# Patient Record
Sex: Male | Born: 1935 | Race: White | Hispanic: No | Marital: Single | Smoking: Former smoker
Health system: Southern US, Community
[De-identification: ages and names within clinical notes are randomized; demographics above are authoritative.]

## PROBLEM LIST (undated history)

## (undated) DIAGNOSIS — I10 Essential (primary) hypertension: Secondary | ICD-10-CM

## (undated) DIAGNOSIS — R001 Bradycardia, unspecified: Secondary | ICD-10-CM

## (undated) DIAGNOSIS — I255 Ischemic cardiomyopathy: Secondary | ICD-10-CM

## (undated) DIAGNOSIS — I441 Atrioventricular block, second degree: Secondary | ICD-10-CM

## (undated) DIAGNOSIS — R9430 Abnormal result of cardiovascular function study, unspecified: Secondary | ICD-10-CM

## (undated) DIAGNOSIS — E119 Type 2 diabetes mellitus without complications: Secondary | ICD-10-CM

## (undated) DIAGNOSIS — N433 Hydrocele, unspecified: Secondary | ICD-10-CM

## (undated) DIAGNOSIS — I340 Nonrheumatic mitral (valve) insufficiency: Secondary | ICD-10-CM

## (undated) DIAGNOSIS — IMO0002 Reserved for concepts with insufficient information to code with codable children: Secondary | ICD-10-CM

## (undated) DIAGNOSIS — I2510 Atherosclerotic heart disease of native coronary artery without angina pectoris: Secondary | ICD-10-CM

## (undated) DIAGNOSIS — I5022 Chronic systolic (congestive) heart failure: Secondary | ICD-10-CM

## (undated) HISTORY — DX: Abnormal result of cardiovascular function study, unspecified: R94.30

## (undated) HISTORY — DX: Reserved for concepts with insufficient information to code with codable children: IMO0002

## (undated) HISTORY — PX: CARDIAC SURGERY: SHX584

---

## 1997-07-14 HISTORY — PX: CORONARY STENT PLACEMENT: SHX1402

## 2001-03-08 ENCOUNTER — Emergency Department (HOSPITAL_COMMUNITY): Admission: EM | Admit: 2001-03-08 | Discharge: 2001-03-08 | Payer: Self-pay | Admitting: Emergency Medicine

## 2001-10-08 ENCOUNTER — Inpatient Hospital Stay (HOSPITAL_COMMUNITY): Admission: EM | Admit: 2001-10-08 | Discharge: 2001-10-21 | Payer: Self-pay | Admitting: Emergency Medicine

## 2001-10-08 ENCOUNTER — Encounter: Payer: Self-pay | Admitting: Emergency Medicine

## 2001-10-12 ENCOUNTER — Encounter: Payer: Self-pay | Admitting: Cardiothoracic Surgery

## 2001-10-12 DIAGNOSIS — I2510 Atherosclerotic heart disease of native coronary artery without angina pectoris: Secondary | ICD-10-CM

## 2001-10-12 HISTORY — PX: CORONARY ARTERY BYPASS GRAFT: SHX141

## 2001-10-12 HISTORY — DX: Atherosclerotic heart disease of native coronary artery without angina pectoris: I25.10

## 2001-10-13 ENCOUNTER — Encounter: Payer: Self-pay | Admitting: Cardiothoracic Surgery

## 2001-10-14 ENCOUNTER — Encounter: Payer: Self-pay | Admitting: Cardiothoracic Surgery

## 2001-10-15 ENCOUNTER — Encounter: Payer: Self-pay | Admitting: Cardiothoracic Surgery

## 2001-10-16 ENCOUNTER — Encounter: Payer: Self-pay | Admitting: Cardiothoracic Surgery

## 2001-10-19 ENCOUNTER — Encounter: Payer: Self-pay | Admitting: Cardiothoracic Surgery

## 2001-10-31 ENCOUNTER — Emergency Department (HOSPITAL_COMMUNITY): Admission: EM | Admit: 2001-10-31 | Discharge: 2001-10-31 | Payer: Self-pay | Admitting: Emergency Medicine

## 2001-12-29 ENCOUNTER — Encounter: Payer: Self-pay | Admitting: General Surgery

## 2001-12-30 ENCOUNTER — Observation Stay (HOSPITAL_COMMUNITY): Admission: RE | Admit: 2001-12-30 | Discharge: 2001-12-31 | Payer: Self-pay | Admitting: General Surgery

## 2013-04-02 ENCOUNTER — Emergency Department (INDEPENDENT_AMBULATORY_CARE_PROVIDER_SITE_OTHER)
Admission: EM | Admit: 2013-04-02 | Discharge: 2013-04-02 | Disposition: A | Discharge status: Home / Self Care | Payer: Medicare Other | Source: Home / Self Care | Attending: Family Medicine | Admitting: Family Medicine

## 2013-04-02 ENCOUNTER — Encounter (HOSPITAL_COMMUNITY): Payer: Self-pay | Admitting: *Deleted

## 2013-04-02 DIAGNOSIS — L0231 Cutaneous abscess of buttock: Secondary | ICD-10-CM

## 2013-04-02 HISTORY — DX: Atherosclerotic heart disease of native coronary artery without angina pectoris: I25.10

## 2013-04-02 MED ORDER — DOXYCYCLINE HYCLATE 100 MG PO CAPS: 100.0000 mg | ORAL_CAPSULE | Freq: Two times a day (BID) | ORAL | Status: DC

## 2013-04-02 NOTE — ED Notes (Signed)
Pt  Reports  About  3  Week  Ago he sustained  An  Injury  To the  Area   Around  His  Groin  - it  Is  Now  Red and  painfull  With  Some  Drainage    Present

## 2013-04-02 NOTE — ED Provider Notes (Signed)
CSN: [id number]     Arrival date & time 04/02/13  1736 History   First MD Initiated Contact with Patient 04/02/13 1753     Chief Complaint  Patient presents with  . Groin Swelling   (Consider location/radiation/quality/duration/timing/severity/associated sxs/prior Treatment) Patient is a 79 y.o. male presenting with abscess. The history is provided by the patient.  Abscess Location:  Ano-genital Ano-genital abscess location:  Perineum Abscess quality: induration   Abscess quality: not draining, no fluctuance, not painful, no redness and not weeping   Red streaking: no   Duration:  3 weeks Progression:  Improving Chronicity:  Recurrent Risk factors: prior abscess     Past Medical History  Diagnosis Date  . CAD (coronary artery disease)    Past Surgical History  Procedure Laterality Date  . Cardiac surgery     History reviewed. No pertinent family history. History  Substance Use Topics  . Smoking status: Not on file  . Smokeless tobacco: Not on file  . Alcohol Use: Not on file    Review of Systems  Constitutional: Negative.   Skin: Positive for wound.    Allergies  Review of patient's allergies indicates no known allergies.  Home Medications   Current Outpatient Rx  Name  Route  Sig  Dispense  Refill  . doxycycline (VIBRAMYCIN) 100 MG capsule   Oral   Take 1 capsule (100 mg total) by mouth 2 (two) times daily.   20 capsule   0    BP 199/121  Pulse 87  Temp(Src) 98.3 F (36.8 C) (Oral)  Resp 18  SpO2 99% Physical Exam  Constitutional: He is oriented to person, place, and time.  Neurological: He is alert and oriented to person, place, and time.  Skin: Skin is warm and dry. No erythema.  Resolving abscess to left buttock.    ED Course  Procedures (including critical care time) Labs Review Labs Reviewed - No data to display Imaging Review No results found.  MDM       Linna Hoff, MD 04/02/13 [phone]

## 2013-07-07 ENCOUNTER — Inpatient Hospital Stay (HOSPITAL_COMMUNITY)
Admission: EM | Admit: 2013-07-07 | Discharge: 2013-07-13 | DRG: 280 | Disposition: A | Discharge status: Home / Self Care | Payer: Medicare Other | Attending: Cardiology | Admitting: Cardiology

## 2013-07-07 ENCOUNTER — Emergency Department (HOSPITAL_COMMUNITY): Payer: Medicare Other

## 2013-07-07 ENCOUNTER — Encounter (HOSPITAL_COMMUNITY): Payer: Self-pay | Admitting: Emergency Medicine

## 2013-07-07 DIAGNOSIS — I10 Essential (primary) hypertension: Secondary | ICD-10-CM | POA: Diagnosis present

## 2013-07-07 DIAGNOSIS — Z951 Presence of aortocoronary bypass graft: Secondary | ICD-10-CM | POA: Insufficient documentation

## 2013-07-07 DIAGNOSIS — I5023 Acute on chronic systolic (congestive) heart failure: Secondary | ICD-10-CM | POA: Diagnosis present

## 2013-07-07 DIAGNOSIS — I2510 Atherosclerotic heart disease of native coronary artery without angina pectoris: Secondary | ICD-10-CM | POA: Diagnosis present

## 2013-07-07 DIAGNOSIS — I441 Atrioventricular block, second degree: Secondary | ICD-10-CM | POA: Diagnosis not present

## 2013-07-07 DIAGNOSIS — I509 Heart failure, unspecified: Secondary | ICD-10-CM | POA: Diagnosis present

## 2013-07-07 DIAGNOSIS — Z91199 Patient's noncompliance with other medical treatment and regimen due to unspecified reason: Secondary | ICD-10-CM

## 2013-07-07 DIAGNOSIS — I2589 Other forms of chronic ischemic heart disease: Secondary | ICD-10-CM | POA: Diagnosis present

## 2013-07-07 DIAGNOSIS — D500 Iron deficiency anemia secondary to blood loss (chronic): Secondary | ICD-10-CM | POA: Diagnosis present

## 2013-07-07 DIAGNOSIS — Z9119 Patient's noncompliance with other medical treatment and regimen: Secondary | ICD-10-CM

## 2013-07-07 DIAGNOSIS — R001 Bradycardia, unspecified: Secondary | ICD-10-CM

## 2013-07-07 DIAGNOSIS — I059 Rheumatic mitral valve disease, unspecified: Secondary | ICD-10-CM | POA: Diagnosis present

## 2013-07-07 DIAGNOSIS — E119 Type 2 diabetes mellitus without complications: Secondary | ICD-10-CM | POA: Diagnosis present

## 2013-07-07 DIAGNOSIS — Z87891 Personal history of nicotine dependence: Secondary | ICD-10-CM

## 2013-07-07 DIAGNOSIS — I214 Non-ST elevation (NSTEMI) myocardial infarction: Principal | ICD-10-CM | POA: Diagnosis present

## 2013-07-07 DIAGNOSIS — Z7982 Long term (current) use of aspirin: Secondary | ICD-10-CM

## 2013-07-07 DIAGNOSIS — I340 Nonrheumatic mitral (valve) insufficiency: Secondary | ICD-10-CM

## 2013-07-07 DIAGNOSIS — Z9861 Coronary angioplasty status: Secondary | ICD-10-CM

## 2013-07-07 HISTORY — DX: Type 2 diabetes mellitus without complications: E11.9

## 2013-07-07 HISTORY — DX: Essential (primary) hypertension: I10

## 2013-07-07 HISTORY — DX: Hydrocele, unspecified: N43.3

## 2013-07-07 HISTORY — DX: Chronic systolic (congestive) heart failure: I50.22

## 2013-07-07 HISTORY — DX: Atrioventricular block, second degree: I44.1

## 2013-07-07 HISTORY — DX: Ischemic cardiomyopathy: I25.5

## 2013-07-07 HISTORY — DX: Nonrheumatic mitral (valve) insufficiency: I34.0

## 2013-07-07 HISTORY — DX: Bradycardia, unspecified: R00.1

## 2013-07-07 LAB — BASIC METABOLIC PANEL
CO2: 29 mEq/L (ref 19–32)
Calcium: 8.9 mg/dL (ref 8.4–10.5)
Chloride: 100 mEq/L (ref 96–112)
Creatinine, Ser: 0.94 mg/dL (ref 0.50–1.35)
GFR calc Af Amer: 90 mL/min — ABNORMAL LOW (ref >90)
Glucose, Bld: 183 mg/dL — ABNORMAL HIGH (ref 70–99)
Sodium: 138 mEq/L (ref 135–145)

## 2013-07-07 LAB — CBC WITH DIFFERENTIAL/PLATELET
Basophils Absolute: 0 10*3/uL (ref 0.0–0.1)
Basophils Relative: 0 % (ref 0–1)
Eosinophils Absolute: 0.1 10*3/uL (ref 0.0–0.7)
Eosinophils Relative: 1 % (ref 0–5)
HCT: 47 % (ref 39.0–52.0)
Hemoglobin: 16 g/dL (ref 13.0–17.0)
MCH: 29 pg (ref 26.0–34.0)
MCHC: 34 g/dL (ref 30.0–36.0)
Monocytes Absolute: 1 10*3/uL (ref 0.1–1.0)
Monocytes Relative: 11 % (ref 3–12)
Neutro Abs: 6.6 10*3/uL (ref 1.7–7.7)
Platelets: 199 10*3/uL (ref 150–400)
RBC: 5.52 MIL/uL (ref 4.22–5.81)
RDW: 12.9 % (ref 11.5–15.5)

## 2013-07-07 LAB — TROPONIN I: Troponin I: >20 ng/mL (ref <0.30)

## 2013-07-07 MED ORDER — HEPARIN BOLUS VIA INFUSION
4000.0000 [IU] | Freq: Once | INTRAVENOUS | Status: AC
  Administered 2013-07-07: 4000 [IU] via INTRAVENOUS
  Filled 2013-07-07: qty 4000

## 2013-07-07 MED ORDER — ACETAMINOPHEN 325 MG PO TABS: 650.0000 mg | ORAL_TABLET | ORAL | Status: DC | PRN

## 2013-07-07 MED ORDER — HEPARIN (PORCINE) IN NACL 100-0.45 UNIT/ML-% IJ SOLN
1450.0000 [IU]/h | INTRAMUSCULAR | Status: DC
  Administered 2013-07-07 – 2013-07-08 (×2): 1450 [IU]/h via INTRAVENOUS
  Filled 2013-07-07: qty 250

## 2013-07-07 MED ORDER — NITROGLYCERIN 0.4 MG SL SUBL: 0.4000 mg | SUBLINGUAL_TABLET | SUBLINGUAL | Status: DC | PRN

## 2013-07-07 MED ORDER — OMEGA-3-ACID ETHYL ESTERS 1 G PO CAPS
1.0000 g | ORAL_CAPSULE | Freq: Two times a day (BID) | ORAL | Status: DC
  Administered 2013-07-07 – 2013-07-13 (×12): 1 g via ORAL
  Filled 2013-07-07 (×13): qty 1

## 2013-07-07 MED ORDER — SODIUM CHLORIDE 0.9 % IV SOLN: 250.0000 mL | INTRAVENOUS | Status: DC | PRN

## 2013-07-07 MED ORDER — ONDANSETRON HCL 4 MG/2ML IJ SOLN
4.0000 mg | Freq: Once | INTRAMUSCULAR | Status: AC
  Administered 2013-07-07: 4 mg via INTRAVENOUS
  Filled 2013-07-07: qty 2

## 2013-07-07 MED ORDER — ASPIRIN EC 81 MG PO TBEC: 81.0000 mg | DELAYED_RELEASE_TABLET | Freq: Every day | ORAL | Status: DC

## 2013-07-07 MED ORDER — SODIUM CHLORIDE 0.9 % IJ SOLN
3.0000 mL | Freq: Two times a day (BID) | INTRAMUSCULAR | Status: DC
  Administered 2013-07-07: 3 mL via INTRAVENOUS

## 2013-07-07 MED ORDER — ONDANSETRON HCL 4 MG/2ML IJ SOLN: 4.0000 mg | Freq: Four times a day (QID) | INTRAMUSCULAR | Status: DC | PRN

## 2013-07-07 MED ORDER — ASPIRIN 325 MG PO TABS
325.0000 mg | ORAL_TABLET | Freq: Every day | ORAL | Status: DC
  Filled 2013-07-07: qty 1

## 2013-07-07 MED ORDER — HEPARIN (PORCINE) IN NACL 100-0.45 UNIT/ML-% IJ SOLN
1250.0000 [IU]/h | INTRAMUSCULAR | Status: DC
  Administered 2013-07-07: 1250 [IU]/h via INTRAVENOUS
  Filled 2013-07-07 (×3): qty 250

## 2013-07-07 MED ORDER — CARVEDILOL 3.125 MG PO TABS
3.1250 mg | ORAL_TABLET | Freq: Two times a day (BID) | ORAL | Status: DC
  Filled 2013-07-07 (×2): qty 1

## 2013-07-07 MED ORDER — ATORVASTATIN CALCIUM 80 MG PO TABS
80.0000 mg | ORAL_TABLET | Freq: Every day | ORAL | Status: DC
  Administered 2013-07-07 – 2013-07-12 (×6): 80 mg via ORAL
  Filled 2013-07-07 (×7): qty 1

## 2013-07-07 MED ORDER — CARVEDILOL 3.125 MG PO TABS
3.1250 mg | ORAL_TABLET | Freq: Two times a day (BID) | ORAL | Status: DC
  Administered 2013-07-07 – 2013-07-09 (×4): 3.125 mg via ORAL
  Filled 2013-07-07 (×6): qty 1

## 2013-07-07 MED ORDER — FISH OIL 1200 MG PO CAPS: 1.0000 | ORAL_CAPSULE | Freq: Two times a day (BID) | ORAL | Status: DC

## 2013-07-07 MED ORDER — ACETAMINOPHEN 500 MG PO TABS: 500.0000 mg | ORAL_TABLET | Freq: Four times a day (QID) | ORAL | Status: DC | PRN

## 2013-07-07 MED ORDER — HEPARIN BOLUS VIA INFUSION
1350.0000 [IU] | Freq: Once | INTRAVENOUS | Status: AC
  Administered 2013-07-07: 1350 [IU] via INTRAVENOUS
  Filled 2013-07-07: qty 1350

## 2013-07-07 MED ORDER — SODIUM CHLORIDE 0.9 % IV SOLN
1.0000 mL/kg/h | INTRAVENOUS | Status: DC
  Administered 2013-07-08: 1 mL/kg/h via INTRAVENOUS

## 2013-07-07 MED ORDER — SODIUM CHLORIDE 0.9 % IJ SOLN: 3.0000 mL | INTRAMUSCULAR | Status: DC | PRN

## 2013-07-07 MED ORDER — ASPIRIN 325 MG PO TABS
325.0000 mg | ORAL_TABLET | ORAL | Status: AC
  Administered 2013-07-07: 325 mg via ORAL
  Filled 2013-07-07: qty 1

## 2013-07-07 MED ORDER — DIAZEPAM 5 MG PO TABS: 10.0000 mg | ORAL_TABLET | Freq: Two times a day (BID) | ORAL | Status: DC | PRN

## 2013-07-07 MED ORDER — RAMIPRIL 2.5 MG PO CAPS
2.5000 mg | ORAL_CAPSULE | Freq: Every day | ORAL | Status: DC
  Administered 2013-07-07 – 2013-07-10 (×4): 2.5 mg via ORAL
  Filled 2013-07-07 (×5): qty 1

## 2013-07-07 MED ORDER — ASPIRIN 325 MG PO TABS: 325.0000 mg | ORAL_TABLET | ORAL | Status: DC

## 2013-07-07 NOTE — Progress Notes (Signed)
ANTICOAGULATION CONSULT NOTE - Initial Consult  Pharmacy Consult:  Heparin Indication: chest pain/ACS  No Known Allergies  Patient Measurements: Height: 5\' 9"  (175.3 cm) Weight: 204 lb (92.534 kg) IBW/kg (Calculated) : 70.7 Heparin Dosing Weight: 90 kg  Vital Signs: Temp: 97.5 F (36.4 C) (12/25 0924) Temp src: Axillary (12/25 0924) BP: 154/74 mmHg (12/25 0945) Pulse Rate: 60 (12/25 0945)  Labs: No results found for this basename: HGB, HCT, PLT, APTT, LABPROT, INR, HEPARINUNFRC, CREATININE, CKTOTAL, CKMB, TROPONINI,  in the last 72 hours  CrCl is unknown because no creatinine reading has been taken.   Medical History: Past Medical History  Diagnosis Date  . CAD (coronary artery disease)        Assessment: 76 YOM with history of CAD, DM and HTN presented with chest pain x 2 days.  Patient stopped taking his medications 5 years ago and has not followed-up with his care since.  Pharmacy consulted to manage IV heparin for ACS.  Baseline labs reviewed.   Goal of Therapy:  Heparin level 0.3-0.7 units/ml Monitor platelets by anticoagulation protocol: Yes    Plan:  - Heparin 4000 units IV bolus x 1, then - Heparin gtt at 1250 units/hr - Check 8 hr HL - Daily HL / CBC    Terralyn Matsumura D. Laney Potash, PharmD, BCPS Pager:  [phone] 07/07/2013, 10:41 AM

## 2013-07-07 NOTE — ED Notes (Signed)
Phlebotomy at bedside.

## 2013-07-07 NOTE — Progress Notes (Signed)
ANTICOAGULATION CONSULT NOTE - Follow-Up Consult  Pharmacy Consult:  Heparin Indication: chest pain/ACS  No Known Allergies  Patient Measurements: Height: 5\' 9"  (175.3 cm) Weight: 195 lb 8.8 oz (88.7 kg) IBW/kg (Calculated) : 70.7 Heparin Dosing Weight: 90 kg  Vital Signs: Temp: 98.2 F (36.8 C) (12/25 1650) Temp src: Oral (12/25 1650) BP: 128/57 mmHg (12/25 1800) Pulse Rate: 79 (12/25 1800)  Labs:  Recent Labs  07/07/13 0946 07/07/13 1304 07/07/13 1513 07/07/13 1826  HGB 16.0  --   --   --   HCT 47.0  --   --   --   PLT 199  --   --   --   HEPARINUNFRC  --   --   --  0.20*  CREATININE  --  0.94  --   --   TROPONINI  --  >20.00* >20.00* >20.00*    Estimated Creatinine Clearance: 70.2 ml/min (by C-G formula based on Cr of 0.94).   Medical History: Past Medical History  Diagnosis Date  . CAD (coronary artery disease) 10/2001    LIMA to LAD, SVG to PDA, CX and Diagnonals  . Diabetes mellitus without complication     2003  . Hypertension   . Hydrocele     Excision 12/2001  . CAD (coronary artery disease) 1999    PCI to LAD    Assessment: 35 YOM with history of CAD, DM and HTN presented with chest pain x 2 days.  Patient stopped taking his medications 5 years ago and has not followed-up with his care since.  Pharmacy consulted to manage IV heparin for ACS.  Baseline labs reviewed. Heparin level came back subtherapeutic at 0.20. Nurse reported no problems with infusion. No S/S of bleeding noted.    Goal of Therapy:  Heparin level 0.3-0.7 units/ml Monitor platelets by anticoagulation protocol: Yes   Plan:  - Heparin 1350 units IV bolus x 1, then - Heparin gtt at 1450 units/hr - Check 8 hr HL - Daily HL / CBC  Deshana Rominger A. Lenon Ahmadi, PharmD Clinical Pharmacist - Resident Pager: [phone] Pharmacy: [phone] 07/07/2013 7:15 PM

## 2013-07-07 NOTE — ED Provider Notes (Signed)
CSN: [id number]     Arrival date & time 07/07/13  4098 History   First MD Initiated Contact with Patient 07/07/13 [phone]     Chief Complaint  Patient presents with  . Chest Pain   (Consider location/radiation/quality/duration/timing/severity/associated sxs/prior Treatment) HPI Comments: Patient is 79 year old male with PMHx significant for CAD with CABG 10 years ago and stent placement 5 years ago and HTN and DM.  He states that 5 years ago he stopped seeing all physician's and "took control of my health".  He states he also stopped all medication since then.  He denies any problems since then.  He reports that his pain began yesterday morning, he reports that he was going about his normal routine when he developed left anterior chest pressure with radiation into his left arm.  He reports nausea and shortness of breath with this as well.  He states that his left hand was tingling.  He states he told his son this morning and he called 911 though he did not think this was necessary.  He reports that the pain spontaneously went away before EMS arrived.  He reports he is now completely pain free.  He does not believe any work up is necessary but will allow [place] giving him an ASA, doing an EKG, chest x-ray and drawing blood however he states that he will not allow an IV and that he will not allow [place] to act on any findings.   Patient is a 79 y.o. male presenting with chest pain. The history is provided by the patient and a relative. No language interpreter was used.  Chest Pain Pain location:  L chest Pain quality: pressure and tightness   Pain radiates to:  L arm Pain radiates to the back: no   Pain severity:  Moderate Onset quality:  Sudden Duration:  24 hours Timing:  Constant Progression:  Resolved Chronicity:  New Context: at rest   Context: not breathing, not eating, not lifting, no movement, not raising an arm and no stress   Relieved by:  Nothing Worsened by:  Nothing tried Ineffective  treatments:  None tried Associated symptoms: nausea, numbness and shortness of breath   Associated symptoms: no abdominal pain, no anorexia, no anxiety, no back pain, no cough, no diaphoresis, no dizziness, no dysphagia, no fever, no headache, no lower extremity edema, no orthopnea, no palpitations, not vomiting and no weakness   Risk factors: coronary artery disease and male sex   Risk factors: no prior DVT/PE     Past Medical History  Diagnosis Date  . CAD (coronary artery disease)    Past Surgical History  Procedure Laterality Date  . Cardiac surgery     No family history on file. History  Substance Use Topics  . Smoking status: Former Smoker    Quit date: 07/07/1961  . Smokeless tobacco: Not on file  . Alcohol Use: Yes     Comment: occasionally    Review of Systems  Constitutional: Negative for fever and diaphoresis.  HENT: Negative for trouble swallowing.   Respiratory: Positive for shortness of breath. Negative for cough.   Cardiovascular: Positive for chest pain. Negative for palpitations and orthopnea.  Gastrointestinal: Positive for nausea. Negative for vomiting, abdominal pain and anorexia.  Musculoskeletal: Negative for back pain.  Neurological: Positive for numbness. Negative for dizziness, weakness and headaches.  All other systems reviewed and are negative.    Allergies  Review of patient's allergies indicates no known allergies.  Home Medications  Current Outpatient Rx  Name  Route  Sig  Dispense  Refill  . acetaminophen (TYLENOL) 500 MG tablet   Oral   Take 500 mg by mouth every 6 (six) hours as needed for mild pain.         Marland Kitchen aspirin 325 MG tablet   Oral   Take 325 mg by mouth daily.         . Omega-3 Fatty Acids (FISH OIL) 1200 MG CAPS   Oral   Take 1 capsule by mouth 2 (two) times daily.          BP 162/84  Pulse 61  Temp(Src) 97.5 F (36.4 C) (Axillary)  Resp 19  Ht 5\' 9"  (1.753 m)  Wt 204 lb (92.534 kg)  BMI 30.11 kg/m2   SpO2 97% Physical Exam  Nursing note and vitals reviewed. Constitutional: He is oriented to person, place, and time. He appears well-developed and well-nourished. No distress.  HENT:  Head: Normocephalic and atraumatic.  Right Ear: External ear normal.  Left Ear: External ear normal.  Nose: Nose normal.  Mouth/Throat: Oropharynx is clear and moist. No oropharyngeal exudate.  Eyes: Conjunctivae are normal. Pupils are equal, round, and reactive to light. No scleral icterus.  Neck: Normal range of motion. Neck supple. No JVD present. No tracheal deviation present. No thyromegaly present.  Cardiovascular: Normal rate, regular rhythm and normal heart sounds.  Exam reveals no gallop and no friction rub.   No murmur heard. Pulmonary/Chest: Effort normal and breath sounds normal. No respiratory distress. He has no wheezes. He has no rales. He exhibits no tenderness.  Abdominal: Soft. Bowel sounds are normal. He exhibits no distension. There is no tenderness. There is no rebound and no guarding.  Musculoskeletal: Normal range of motion. He exhibits no edema and no tenderness.  Lymphadenopathy:    He has no cervical adenopathy.  Neurological: He is alert and oriented to person, place, and time. He exhibits normal muscle tone. Coordination normal.  Skin: Skin is warm and dry. No rash noted. No erythema. No pallor.  Psychiatric: He has a normal mood and affect. His behavior is normal. Judgment and thought content normal.    ED Course  Procedures (including critical care time) Labs Review Labs Reviewed  CBC WITH DIFFERENTIAL  PRO B NATRIURETIC PEPTIDE   Imaging Review No results found.  EKG Interpretation    Date/Time:  Thursday July 07 2013 09:07:22 EST Ventricular Rate:  62 PR Interval:  187 QRS Duration: 106 QT Interval:  403 QTC Calculation: 409 R Axis:   -19 Text Interpretation:  Sinus rhythm Borderline left axis deviation Anteroseptal infarct, old Abnormal T, consider  ischemia, lateral leads Baseline wander in lead(s) II III aVF No previous ECGs available Confirmed by Manus Gunning  MD, STEPHEN ([id number]) on 07/07/2013 9:12:27 AM           Results for orders placed during the hospital encounter of 07/07/13  CBC WITH DIFFERENTIAL      Result Value Range   WBC 9.1  4.0 - 10.5 K/uL   RBC 5.52  4.22 - 5.81 MIL/uL   Hemoglobin 16.0  13.0 - 17.0 g/dL   HCT 60.4  54.0 - 98.1 %   MCV 85.1  78.0 - 100.0 fL   MCH 29.0  26.0 - 34.0 pg   MCHC 34.0  30.0 - 36.0 g/dL   RDW 19.1  47.8 - 29.5 %   Platelets 199  150 - 400 K/uL   Neutrophils Relative % 72  43 - 77 %   Neutro Abs 6.6  1.7 - 7.7 K/uL   Lymphocytes Relative 16  12 - 46 %   Lymphs Abs 1.5  0.7 - 4.0 K/uL   Monocytes Relative 11  3 - 12 %   Monocytes Absolute 1.0  0.1 - 1.0 K/uL   Eosinophils Relative 1  0 - 5 %   Eosinophils Absolute 0.1  0.0 - 0.7 K/uL   Basophils Relative 0  0 - 1 %   Basophils Absolute 0.0  0.0 - 0.1 K/uL  PRO B NATRIURETIC PEPTIDE      Result Value Range   Pro B Natriuretic peptide (BNP) 1002.0 (*) 0 - 450 pg/mL  POCT I-STAT TROPONIN I      Result Value Range   Troponin i, poc 27.83 (*) 0.00 - 0.08 ng/mL   Comment NOTIFIED PHYSICIAN     Comment 3            Dg Chest 2 View  07/07/2013   CLINICAL DATA:  Chest pain and pressure  EXAM: CHEST  2 VIEW  COMPARISON:  Nondigital exams 2003 and earlier  FINDINGS: The heart size and mediastinal contours are within normal limits. Both lungs are clear. The visualized skeletal structures are unremarkable. Evidence of CABG.  IMPRESSION: No active cardiopulmonary disease.   Electronically Signed   By: Christiana Pellant M.D.   On: 07/07/2013 10:05      MDM  NSTEMI  Patient who has been non-compliant with medical regimen for the past 5 years presents today with chest pain, ST inversion noted on lateral leads, troponin elevated here.  Patient is willing to stay for some interventions and we have called and spoken with Dr. Myrtis Ser with cardiology  who will see the patient in her ED.   Izola Price Marisue Humble, PA-C 07/07/13 1126

## 2013-07-07 NOTE — ED Notes (Signed)
Cardiology at bedside.

## 2013-07-07 NOTE — ED Notes (Signed)
PA at bedside.

## 2013-07-07 NOTE — ED Notes (Signed)
Son called EMS pt has had chest pain x 2 days but does not want any treatment.  Pt c/o nausea earlier but no CP or nausea now.  Refused ASA and IV per EMS

## 2013-07-07 NOTE — ED Notes (Signed)
Patient presents to ED via ems from home, states that he had chest pain yesterday and this am.  Chest pain stopped right before EMS arrival this am per patient.  Also had left arm pain and hand numbness.  Family persuaded him to come, refused tx by ems other than 12 lead and basic vs.  Currently patient denies chest pain.  Patient has a history of quadruple bypass 7 years ago and stents placed since that time, he is unsure of exact date.  States  He has hx of diabetes and htn.  Pt states that he stopped taking all medications 7 years ago and has not seen any healthcare provider.  Wants to be evaluated but does not seem to want any tx at this time.

## 2013-07-07 NOTE — ED Notes (Signed)
Notified EDP of panic value per lab.  Troponin >20.  EDP acknowledged

## 2013-07-07 NOTE — ED Notes (Signed)
Pt states that his chest pain actually began Monday.

## 2013-07-07 NOTE — Progress Notes (Signed)
Patient ID: Billy Hood, male   DOB: Dec 04, 1931, 79 y.o.   MRN: [id number] Renal function has now been obtained. Creatinine is normal. I will start an ACE inhibitor.  Jerral Bonito, MD

## 2013-07-07 NOTE — ED Notes (Signed)
Patient currently does not want IV access

## 2013-07-07 NOTE — H&P (Signed)
CARDIOLOGY HISTORY AND PHYSICAL   Patient ID: Billy Hood MRN: [id number]  DOB/AGE: 08-07-1931 79 y.o. Admit date: 07/07/2013  Primary Care Physician: No PCP Per Patient Primary Cardiologist: Erie Noe  Clinical Summary Billy Hood is a 79 y.o.male brought to the emergency room by his son with complaints of chest pain lasting approximately 24 hours, without waxing and waning, described as pressure over the left chest. He states that he has had tingling in his left arm. Denies shortness of breath dizziness or diaphoresis. He states he could feel it when he took deep breaths. The pain was not severe probably 5/10. The patient works as a Interior and spatial designer and was able to work yesterday without limiting his schedule. His son saw him today, he described his chest pain, and the son insisted he come to the emergency room.   (KATZ- at the end of the evaluation the patient admitted that he had mild chest discomfort intermittently during the week before it became prolonged yesterday)    The patient has a history of coronary artery bypass grafting which was completed by Dr. Alla German, had been seen by Dr. Bonnee Quin  Previous to this with abnormal cardiac catheterization dated 10/08/2001. Cardiac catheterization demonstrated LAD with an 80% tubular lesion, sidehole with 90% multiple discrete lesions. Circumflex is 80% mid vessel lesion RCA had 30% multiple discrete lesions within 50% PL. The patient has a previous stent to the mid LAD placed by Dr. Tedra Senegal in 1999.    Coronary artery bypass grafting was completed in April 2003, with LIMA to LAD, SVG to PDA, SVG to circumflex, and SVG to diagonal artery. The patient was also found to have a postoperative ileus, anemia secondary to blood loss. Was also diagnosed with diabetes.   The patient stopped taking his medication approximately 5 years ago. "I prospered off of the medications" has not been seen by cardiology or primary care in over 5 years.     Review of labs demonstrates hemoglobin of 16.0 hematocrit of 47.0. Troponin elevated at 27.83 with a pro BNP of 1002.0 EKG reveals sinus rhythm with T wave inversion V1 and also in the lateral leads. Vital signs on admission the ER 162/84 heart rate 67 O2 sat 100% with respirations 15. Chest x-ray was negative for CHF or acute cardiopulmonary disease. He was started on heparin after a bolus, and given aspirin and Zofran.   No Known Allergies  Home Medications  (Not in a hospital admission)  Scheduled Medications    Infusions . heparin 1,250 Units/hr (07/07/13 1055)    PRN Medications    Past Medical History  Diagnosis Date  . CAD (coronary artery disease) 10/2001    LIMA to LAD, SVG to PDA, CX and Diagnonals  . Diabetes mellitus without complication     2003  . Hypertension   . Hydrocele     Excision 12/2001  . CAD (coronary artery disease) 1999    PCI to LAD    Past Surgical History  Procedure Laterality Date  . Cardiac surgery    . Coronary artery bypass graft  10/2001  . Coronary stent placement  1999    LAD    No family history on file.  Social History Billy Hood reports that he quit smoking about 52 years ago. He does not have any smokeless tobacco history on file. Billy Hood reports that he drinks alcohol.  Review of Systems Patient denies fever, chills, headache, sweats, rash, change in vision, change in hearing, cough, nausea vomiting,  urinary symptoms. All other systems are reviewed and are negative.  Physical Examination Temp:  [97.5 F (36.4 C)] 97.5 F (36.4 C) (12/25 0924) Pulse Rate:  [54-68] 68 (12/25 1145) Resp:  [15-21] 21 (12/25 1145) BP: (123-162)/(60-84) 137/66 mmHg (12/25 1145) SpO2:  [94 %-100 %] 96 % (12/25 1145) Weight:  [204 lb (92.534 kg)] 204 lb (92.534 kg) (12/25 0924) No intake or output data in the 24 hours ending 07/07/13 1157  Patient is oriented to person time and place. Affect is normal. His son is in the room at the time  of evaluation. There is no jugulovenous distention. Lungs are clear. Respiratory effort is nonlabored. Cardiac exam reveals S1 and S2. There no clicks or significant murmurs. The abdomen is soft. There is no peripheral edema. There no musculoskeletal deformities. There are no skin rashes.   Lab Results  Basic Metabolic Panel: No results found for this basename: NA, K, CL, CO2, GLUCOSE, BUN, CREATININE, CALCIUM, MG, PHOS,  in the last 168 hours  Liver Function Tests: No results found for this basename: AST, ALT, ALKPHOS, BILITOT, PROT, ALBUMIN,  in the last 168 hours  CBC:  Recent Labs Lab 07/07/13 0946  WBC 9.1  NEUTROABS 6.6  HGB 16.0  HCT 47.0  MCV 85.1  PLT 199    Radiology Dg Chest 2 View  07/07/2013   CLINICAL DATA:  Chest pain and pressure  EXAM: CHEST  2 VIEW  COMPARISON:  Nondigital exams 2003 and earlier  FINDINGS: The heart size and mediastinal contours are within normal limits. Both lungs are clear. The visualized skeletal structures are unremarkable. Evidence of CABG.  IMPRESSION: No active cardiopulmonary disease.   Electronically Signed   By: Christiana Pellant M.D.   On: 07/07/2013 10:05    Prior Cardiac Testing/Procedures:  1. PCI with Stent to LAD 1999 2. Cardiac Cath 09/2001 Multivessel Disease 3. CABG X 5 10/2001  ECG: NSR with T-wave inversion in Lead I and V4-V6   telemetry    I have reviewed telemetry. Patient is maintaining sinus rhythm.   Impression and Recommendations 1. NSTEMI: Patient had some chest pain during the week.  On July 06, 2013 he had worsening  chest pressure radiating down the left arm, lasting for "24 hours", occurred in the early morning hours of 07/06/2013. Waited until this a.m. to come to the hospital after expressing his symptoms to his son. Initial cardiac enzyme is positive at 27.83. The patient is very reluctant to be admitted to the hospital, he has spoken with Dr. Myrtis Ser for a lengthy period of time, was explanation of the  patient's current medical status, and need to be admitted with cardiac catheterization in a.m. He will remain on heparin. Start carvediolol 3.25 mg BID. He has agreed to be admitted and to have cardiac cath. So far I have not been able to find any chemistry lab results. He soon as I have these I will start an ACE inhibitor unless there is unexpected renal dysfunction. We have no recent labs on this gentleman.  2. History of Diabetes: Check hemoglobin A1c.  3. Hypertension: Continue to monitor. ACE inhibitor to be started as soon as we document renal function  The patient underwent bypass surgery in 2003. He had seen Dr. Riley Kill in the past. He did very well for many years but had no followup. More recently he has stopped most of his medicines thinking that they were making him feel poorly. He has now had a significant troponin elevation event. The  EKG does not show any dramatic changes. I am waiting for more troponins. I had a very long discussion with the patient trying to convince him to stay in the hospital for workup. Ultimately he agreed with his son being in favor. We will proceed with cardiac catheterization tomorrow. It will be very important to discuss each step of history but with him. He does not want any extra treatment or medications that he does not agree to. I have agreed to take him over is my patient.  Signed: Joni Reining 07/07/2013, 11:57 AM Patient seen and examined. I agree with the assessment and plan as detailed above. See also my additional thoughts below.   My thoughts are reflected in the note above. He is co-written by myself and Harriet Pho, NP.  We need to proceed with cardiac catheterization tomorrow.  Willa Rough, MD, [facility] 07/07/2013 1:11 PM

## 2013-07-07 NOTE — ED Notes (Signed)
EDP at bedside  

## 2013-07-08 ENCOUNTER — Encounter (HOSPITAL_COMMUNITY)
Admission: EM | Disposition: A | Discharge status: Home / Self Care | Payer: Self-pay | Source: Home / Self Care | Attending: Cardiology

## 2013-07-08 DIAGNOSIS — I2510 Atherosclerotic heart disease of native coronary artery without angina pectoris: Secondary | ICD-10-CM

## 2013-07-08 DIAGNOSIS — I059 Rheumatic mitral valve disease, unspecified: Secondary | ICD-10-CM

## 2013-07-08 HISTORY — PX: LEFT HEART CATHETERIZATION WITH CORONARY ANGIOGRAM: SHX5451

## 2013-07-08 LAB — BASIC METABOLIC PANEL
BUN: 22 mg/dL (ref 6–23)
CO2: 28 mEq/L (ref 19–32)
Calcium: 8.4 mg/dL (ref 8.4–10.5)
Chloride: 100 mEq/L (ref 96–112)
Glucose, Bld: 212 mg/dL — ABNORMAL HIGH (ref 70–99)
Sodium: 137 mEq/L (ref 135–145)

## 2013-07-08 LAB — LIPID PANEL
HDL: 41 mg/dL (ref >39)
LDL Cholesterol: 102 mg/dL — ABNORMAL HIGH (ref 0–99)
Triglycerides: 84 mg/dL (ref <150)
VLDL: 17 mg/dL (ref 0–40)

## 2013-07-08 LAB — CBC
HCT: 44.2 % (ref 39.0–52.0)
Hemoglobin: 14.4 g/dL (ref 13.0–17.0)
MCH: 28.2 pg (ref 26.0–34.0)
Platelets: 174 10*3/uL (ref 150–400)
RBC: 5.11 MIL/uL (ref 4.22–5.81)

## 2013-07-08 LAB — TROPONIN I
Troponin I: >20 ng/mL (ref <0.30)
Troponin I: >20 ng/mL (ref <0.30)

## 2013-07-08 LAB — HEPARIN LEVEL (UNFRACTIONATED): Heparin Unfractionated: 0.43 IU/mL (ref 0.30–0.70)

## 2013-07-08 LAB — HEMOGLOBIN A1C: Mean Plasma Glucose: 203 mg/dL — ABNORMAL HIGH (ref <117)

## 2013-07-08 LAB — POCT ACTIVATED CLOTTING TIME: Activated Clotting Time: 105 seconds

## 2013-07-08 SURGERY — LEFT HEART CATHETERIZATION WITH CORONARY ANGIOGRAM
Anesthesia: LOCAL

## 2013-07-08 MED ORDER — LIDOCAINE HCL (PF) 1 % IJ SOLN
INTRAMUSCULAR | Status: AC
  Filled 2013-07-08: qty 30

## 2013-07-08 MED ORDER — FENTANYL CITRATE 0.05 MG/ML IJ SOLN
INTRAMUSCULAR | Status: AC
  Filled 2013-07-08: qty 2

## 2013-07-08 MED ORDER — ASPIRIN 81 MG PO CHEW
CHEWABLE_TABLET | ORAL | Status: AC
  Filled 2013-07-08: qty 4

## 2013-07-08 MED ORDER — CLOPIDOGREL BISULFATE 75 MG PO TABS
75.0000 mg | ORAL_TABLET | Freq: Every day | ORAL | Status: DC
  Administered 2013-07-08 – 2013-07-13 (×6): 75 mg via ORAL
  Filled 2013-07-08 (×7): qty 1

## 2013-07-08 MED ORDER — ASPIRIN 81 MG PO CHEW
81.0000 mg | CHEWABLE_TABLET | Freq: Every day | ORAL | Status: DC
  Administered 2013-07-09 – 2013-07-13 (×5): 81 mg via ORAL
  Filled 2013-07-08 (×4): qty 1

## 2013-07-08 MED ORDER — ASPIRIN 81 MG PO CHEW
324.0000 mg | CHEWABLE_TABLET | Freq: Once | ORAL | Status: AC
  Administered 2013-07-08: 324 mg via ORAL

## 2013-07-08 MED ORDER — SODIUM CHLORIDE 0.9 % IV SOLN: INTRAVENOUS | Status: AC

## 2013-07-08 MED ORDER — MIDAZOLAM HCL 2 MG/2ML IJ SOLN
INTRAMUSCULAR | Status: AC
  Filled 2013-07-08: qty 2

## 2013-07-08 MED ORDER — HEPARIN (PORCINE) IN NACL 2-0.9 UNIT/ML-% IJ SOLN
INTRAMUSCULAR | Status: AC
  Filled 2013-07-08: qty 1500

## 2013-07-08 MED ORDER — NITROGLYCERIN 0.2 MG/ML ON CALL CATH LAB
INTRAVENOUS | Status: AC
  Filled 2013-07-08: qty 1

## 2013-07-08 NOTE — Progress Notes (Signed)
ANTICOAGULATION CONSULT NOTE - Follow Up Consult  Pharmacy Consult for heparin Indication: NSTEMI  Labs:  Recent Labs  07/07/13 0946  07/07/13 1304 07/07/13 1513 07/07/13 1826 07/08/13 0035 07/08/13 0455  HGB 16.0  --   --   --   --   --  14.4  HCT 47.0  --   --   --   --   --  44.2  PLT 199  --   --   --   --   --  174  HEPARINUNFRC  --   --   --   --  0.20*  --  0.43  CREATININE  --   --  0.94  --   --   --   --   TROPONINI  --   < > >20.00* >20.00* >20.00* >20.00*  --   < > = values in this interval not displayed.   Assessment/Plan:  79yo male now therapeutic on heparin after rate increase.  Scheduled for cath this am.  Will f/u after cath.  Vernard Gambles, PharmD, BCPS  07/08/2013,5:56 AM

## 2013-07-08 NOTE — Progress Notes (Signed)
Utilization review completed. Reizy Dunlow, RN, BSN. 

## 2013-07-08 NOTE — ED Provider Notes (Signed)
Medical screening examination/treatment/procedure(s) were conducted as a shared visit with non-physician practitioner(s) and myself.  I personally evaluated the patient during the encounter.  Waxing and waning chest pressure x 24 hours with nausea and SOB.  Hx CABG and CAD, stopped meds 5 years ago.  Chest pain free now. EKG with lateral T wave inversions.  Troponin >20. ASA, heparin gtt, cardiology admit. Patient reluctant for any treatment but agreeable after discussion of seriousness of MI  CRITICAL CARE Performed by: Glynn Octave Total critical care time: 30 Critical care time was exclusive of separately billable procedures and treating other patients. Critical care was necessary to treat or prevent imminent or life-threatening deterioration. Critical care was time spent personally by me on the following activities: development of treatment plan with patient and/or surrogate as well as nursing, discussions with consultants, evaluation of patient's response to treatment, examination of patient, obtaining history from patient or surrogate, ordering and performing treatments and interventions, ordering and review of laboratory studies, ordering and review of radiographic studies, pulse oximetry and re-evaluation of patient's condition.   EKG Interpretation    Date/Time:  Thursday July 07 2013 09:07:22 EST Ventricular Rate:  62 PR Interval:  187 QRS Duration: 106 QT Interval:  403 QTC Calculation: 409 R Axis:   -19 Text Interpretation:  Sinus rhythm Borderline left axis deviation Anteroseptal infarct, old Abnormal T, consider ischemia, lateral leads Baseline wander in lead(s) II III aVF No previous ECGs available Confirmed by Manus Gunning  MD, Majesti Gambrell ([id number]) on 07/07/2013 9:12:27 AM             Glynn Octave, MD 07/08/13 1008

## 2013-07-08 NOTE — Progress Notes (Signed)
  Echocardiogram 2D Echocardiogram has been performed.  Billy Hood 07/08/2013, 12:14 PM

## 2013-07-08 NOTE — CV Procedure (Signed)
     Cardiac Catheterization Operative Report  Billy Hood [id number] 12/26/20149:10 AM No PCP Per Patient  Procedure Performed:  1. Left Heart Catheterization 2. Selective Coronary Angiography 3. Left ventricular angiogram 4. SVG angiography 5. LIMA graft angiography  Operator: Verne Carrow, MD  Indication:  79 yo male with history of CAD s/p CABG, DM, HTN with chest c/w unstable angina for over a week. Presented to ED on 07/07/13 and troponin has been over 20. No chest pain last 12 hours.                                     Procedure Details: The risks, benefits, complications, treatment options, and expected outcomes were discussed with the patient. The patient and/or family concurred with the proposed plan, giving informed consent. The patient was brought to the cath lab after IV hydration was begun and oral premedication was given. The patient was further sedated with Versed and Fentanyl. The right groin was prepped and draped in the usual manner. Using the modified Seldinger access technique, a 5 French sheath was placed in the right femoral artery. Standard diagnostic catheters were used to perform selective coronary angiography. The SVG to the RCA was engaged with a JR4 catheter. The SVG to the Diagonal was engaged with an AL-1 catheter. The SVG to the OM was engaged with a LCB catheter. The LIMA was non-selectively engaged with a JR4 catheter. A pigtail catheter was used to measure left ventricular pressures.   There were no immediate complications. The patient was taken to the recovery area in stable condition.   Hemodynamic Findings: Central aortic pressure: 124/67 Left ventricular pressure: 121/15/32  Angiographic Findings:  Left main: 60% ostial stenosis.   Left Anterior Descending Artery: Large caliber vessel that courses to the apex. The proximal vessel has diffuse 50% stenosis. The mid vessel has 100% occlusion. The mid and distal vessel fills from the  patent IMA graft. The diagonal branch fills from the patent vein graft and from antegrade flow.   Circumflex Artery: Moderate caliber vessel with 50% ostial stenosis, 100% mid occlusion. The moderate caliber obtuse marginal branch is seen to fill from the patent vein graft.   Right Coronary Artery: Large dominant vessel with diffuse 30% stenosis in the proximal, mid and distal vessel. The PDA fills from antegrade flow and from the patent vein graft.   Graft Anatomy:  SVG to Diagonal is patent SVG to PDA is patent SVG to OM is patent LIMA to mid LAD is patent  Left Ventricular Angiogram: Deferred.   Impression: 1. Triple vessel CAD s/p CABG with 4/4 patent bypass grafts 2. NSTEMI, no obvious culprit. He could have had restoration of flow in an occluded vessel after heparin drip was started. Most likely a small branch vessel has become occluded and is not obvious on angiography.   Recommendations: Will add Plavix 75 mg po Qdaily, lower ASA to 81 mg po Qdaily, continue statin, beta blocker and Ace-inh. Will arrange echo today.        Complications:  None. The patient tolerated the procedure well.

## 2013-07-08 NOTE — Interval H&P Note (Signed)
History and Physical Interval Note:  07/08/2013 9:02 AM  Billy Hood  has presented today for cardiac cath with the diagnosis of NSTEMI, known CAD s/p CABG.  The various methods of treatment have been discussed with the patient and family. After consideration of risks, benefits and other options for treatment, the patient has consented to  Procedure(s): LEFT HEART CATHETERIZATION WITH CORONARY ANGIOGRAM (N/A) as a surgical intervention .  The patient's history has been reviewed, patient examined, no change in status, stable for surgery.  I have reviewed the patient's chart and labs.  Questions were answered to the patient's satisfaction.    Cath Lab Visit (complete for each Cath Lab visit)  Clinical Evaluation Leading to the Procedure:   ACS: yes  Non-ACS:    Anginal Classification: CCS IV  Anti-ischemic medical therapy: No Therapy  Non-Invasive Test Results: No non-invasive testing performed  Prior CABG: Previous CABG         MCALHANY,CHRISTOPHER

## 2013-07-09 LAB — CBC
HCT: 44.7 % (ref 39.0–52.0)
Hemoglobin: 14.7 g/dL (ref 13.0–17.0)
MCHC: 32.9 g/dL (ref 30.0–36.0)
MCV: 86.6 fL (ref 78.0–100.0)
RDW: 13.2 % (ref 11.5–15.5)
WBC: 10.2 10*3/uL (ref 4.0–10.5)

## 2013-07-09 LAB — BASIC METABOLIC PANEL
BUN: 17 mg/dL (ref 6–23)
Creatinine, Ser: 0.81 mg/dL (ref 0.50–1.35)
GFR calc Af Amer: >90 mL/min (ref >90)
GFR calc non Af Amer: 82 mL/min — ABNORMAL LOW (ref >90)
Glucose, Bld: 201 mg/dL — ABNORMAL HIGH (ref 70–99)
Potassium: 4.1 mEq/L (ref 3.5–5.1)
Sodium: 135 mEq/L (ref 135–145)

## 2013-07-09 NOTE — Progress Notes (Signed)
    Subjective:  Denies CP or dyspnea   Objective:  Filed Vitals:   07/09/13 0455 07/09/13 0500 07/09/13 0600 07/09/13 0735  BP:   143/67 123/53  Pulse:  72 56 54  Temp: 98.7 F (37.1 C)   98.2 F (36.8 C)  TempSrc: Oral   Oral  Resp:    18  Height:      Weight:      SpO2:  98% 99% 95%    Intake/Output from previous day:  Intake/Output Summary (Last 24 hours) at 07/09/13 1018 Last data filed at 07/09/13 0456  Gross per 24 hour  Intake    930 ml  Output    900 ml  Net     30 ml    Physical Exam: Physical exam: Well-developed well-nourished in no acute distress.  Skin is warm and dry.  HEENT is normal.  Neck is supple.  Chest is clear to auscultation with normal expansion.  Cardiovascular exam is irregular Abdominal exam nontender or distended. No masses palpated. Right groin with no hematoma and no bruit. Extremities show no edema. neuro grossly intact    Lab Results: Basic Metabolic Panel:  Recent Labs  09/81/19 0455 07/09/13 0607  NA 137 135  K 3.9 4.1  CL 100 102  CO2 28 24  GLUCOSE 212* 201*  BUN 22 17  CREATININE 0.95 0.81  CALCIUM 8.4 8.3*   CBC:  Recent Labs  07/07/13 0946 07/08/13 0455 07/09/13 0607  WBC 9.1 10.3 10.2  NEUTROABS 6.6  --   --   HGB 16.0 14.4 14.7  HCT 47.0 44.2 44.7  MCV 85.1 86.5 86.6  PLT 199 174 168   Cardiac Enzymes:  Recent Labs  07/07/13 1826 07/08/13 0035 07/08/13 0655  TROPONINI >20.00* >20.00* >20.00*     Assessment/Plan:  1 status post myocardial infarction-cath results noted. Plan medical therapy. Continue aspirin, Plavix, statin and ACE inhibitor. He is having Mobitz 1 on telemetry. Discontinue carvedilol. 2 Mobitz 1 second-degree AV block-hold carvedilol and follow. 3 coronary artery disease-continue aspirin, Plavix and statin. 4 ischemic cardiomyopathy-continue ACE inhibitor. 5 mitral regurgitation-followup echocardiograms in the future. 6 diabetes mellitus-follow CBG.  Olga Millers 07/09/2013, 10:18 AM

## 2013-07-10 LAB — CBC
HCT: 41.1 % (ref 39.0–52.0)
Hemoglobin: 13.7 g/dL (ref 13.0–17.0)
MCH: 28.4 pg (ref 26.0–34.0)
MCHC: 33.3 g/dL (ref 30.0–36.0)
MCV: 85.3 fL (ref 78.0–100.0)
Platelets: 200 10*3/uL (ref 150–400)
RBC: 4.82 MIL/uL (ref 4.22–5.81)

## 2013-07-10 MED ORDER — HEPARIN SODIUM (PORCINE) 5000 UNIT/ML IJ SOLN
5000.0000 [IU] | Freq: Three times a day (TID) | INTRAMUSCULAR | Status: DC
  Administered 2013-07-10 – 2013-07-13 (×9): 5000 [IU] via SUBCUTANEOUS
  Filled 2013-07-10 (×12): qty 1

## 2013-07-10 MED ORDER — HEPARIN SODIUM (PORCINE) 5000 UNIT/ML IJ SOLN
5000.0000 [IU] | Freq: Three times a day (TID) | INTRAMUSCULAR | Status: DC
  Filled 2013-07-10 (×4): qty 1

## 2013-07-10 NOTE — Progress Notes (Signed)
    Subjective:  Denies CP or dyspnea   Objective:  Filed Vitals:   07/09/13 2322 07/09/13 2325 07/10/13 0322 07/10/13 0810  BP:  117/62 105/49 114/45  Pulse:    47  Temp: 98.1 F (36.7 C)  98.2 F (36.8 C) 97.8 F (36.6 C)  TempSrc: Oral  Oral Oral  Resp: 27 20 27    Height:      Weight:      SpO2: 98%  99% 98%    Intake/Output from previous day:  Intake/Output Summary (Last 24 hours) at 07/10/13 1610 Last data filed at 07/10/13 0800  Gross per 24 hour  Intake    840 ml  Output    950 ml  Net   -110 ml    Physical Exam: Physical exam: Well-developed well-nourished in no acute distress.  Skin is warm and dry.  HEENT is normal.  Neck is supple.  Chest is clear to auscultation with normal expansion.  Cardiovascular exam is irregular Abdominal exam nontender or distended. No masses palpated. Right groin with no hematoma and no bruit. Extremities show no edema. neuro grossly intact    Lab Results: Basic Metabolic Panel:  Recent Labs  96/04/54 0455 07/09/13 0607  NA 137 135  K 3.9 4.1  CL 100 102  CO2 28 24  GLUCOSE 212* 201*  BUN 22 17  CREATININE 0.95 0.81  CALCIUM 8.4 8.3*   CBC:  Recent Labs  07/07/13 0946  07/09/13 0607 07/10/13 0455  WBC 9.1  < > 10.2 9.6  NEUTROABS 6.6  --   --   --   HGB 16.0  < > 14.7 13.7  HCT 47.0  < > 44.7 41.1  MCV 85.1  < > 86.6 85.3  PLT 199  < > 168 200  < > = values in this interval not displayed. Cardiac Enzymes:  Recent Labs  07/07/13 1826 07/08/13 0035 07/08/13 0655  TROPONINI >20.00* >20.00* >20.00*     Assessment/Plan:  1 status post myocardial infarction-cath results noted. Plan medical therapy. Continue aspirin, Plavix, statin and ACE inhibitor. Carvedilol DCed secondary to heart block. 2 CHB/Junctional beats/2:1 AV block/Mobitz 1 second-degree AV block-Patient continues with these rhythm disturbances; no symptoms; ? Related to MI; initial ECG with no inferior ST elevation and no conduction  abnormality; no history of syncope; follow telemetry; check ECG; may need pacemaker. 3 coronary artery disease-continue aspirin, Plavix and statin. 4 ischemic cardiomyopathy-continue ACE inhibitor. 5 mitral regurgitation-followup echocardiograms in the future. 6 diabetes mellitus-follow CBG.  Olga Millers 07/10/2013, 8:22 AM

## 2013-07-11 DIAGNOSIS — I5023 Acute on chronic systolic (congestive) heart failure: Secondary | ICD-10-CM

## 2013-07-11 DIAGNOSIS — I441 Atrioventricular block, second degree: Secondary | ICD-10-CM

## 2013-07-11 DIAGNOSIS — I509 Heart failure, unspecified: Secondary | ICD-10-CM

## 2013-07-11 LAB — BASIC METABOLIC PANEL
BUN: 31 mg/dL — ABNORMAL HIGH (ref 6–23)
Calcium: 8.9 mg/dL (ref 8.4–10.5)
Creatinine, Ser: 0.92 mg/dL (ref 0.50–1.35)
GFR calc Af Amer: >90 mL/min (ref >90)
GFR calc non Af Amer: 78 mL/min — ABNORMAL LOW (ref >90)
Glucose, Bld: 291 mg/dL — ABNORMAL HIGH (ref 70–99)
Sodium: 134 mEq/L — ABNORMAL LOW (ref 135–145)

## 2013-07-11 LAB — CBC
HCT: 41.3 % (ref 39.0–52.0)
Hemoglobin: 13.9 g/dL (ref 13.0–17.0)
MCH: 28.5 pg (ref 26.0–34.0)
MCHC: 33.7 g/dL (ref 30.0–36.0)
MCV: 84.8 fL (ref 78.0–100.0)
RDW: 13.1 % (ref 11.5–15.5)

## 2013-07-11 MED ORDER — SPIRONOLACTONE 12.5 MG HALF TABLET
12.5000 mg | ORAL_TABLET | Freq: Every day | ORAL | Status: DC
  Administered 2013-07-11 – 2013-07-13 (×3): 12.5 mg via ORAL
  Filled 2013-07-11 (×3): qty 1

## 2013-07-11 MED ORDER — FUROSEMIDE 10 MG/ML IJ SOLN
40.0000 mg | Freq: Two times a day (BID) | INTRAMUSCULAR | Status: DC
  Administered 2013-07-11 (×2): 40 mg via INTRAVENOUS
  Filled 2013-07-11 (×4): qty 4

## 2013-07-11 MED ORDER — RAMIPRIL 5 MG PO CAPS
5.0000 mg | ORAL_CAPSULE | Freq: Every day | ORAL | Status: DC
  Administered 2013-07-11: 5 mg via ORAL
  Filled 2013-07-11 (×2): qty 1

## 2013-07-11 MED ORDER — POTASSIUM CHLORIDE CRYS ER 20 MEQ PO TBCR
20.0000 meq | EXTENDED_RELEASE_TABLET | Freq: Once | ORAL | Status: AC
  Administered 2013-07-11: 20 meq via ORAL
  Filled 2013-07-11: qty 1

## 2013-07-11 NOTE — Progress Notes (Signed)
Patient ID: Billy Hood, male   DOB: [birth year], 79 y.o.   MRN: [id number]   Subjective:  No chest pain.  He does feel short of breath.  Has had type I 2nd degree AV block.   Objective:  Filed Vitals:   07/11/13 0000 07/11/13 0008 07/11/13 0400 07/11/13 0411  BP: 163/66   154/71  Pulse:      Temp:  98.3 F (36.8 C) 97.7 F (36.5 C)   TempSrc:  Oral Oral   Resp:    23  Height:      Weight:      SpO2:  89%      Intake/Output from previous day:  Intake/Output Summary (Last 24 hours) at 07/11/13 0739 Last data filed at 07/11/13 0600  Gross per 24 hour  Intake    780 ml  Output   1200 ml  Net   -420 ml    Physical Exam: Physical exam: Well-developed well-nourished in no acute distress.  Skin is warm and dry.  HEENT is normal.  Neck is supple, JVP 10 cm.  Decreased breath sounds at bases bilaterally. Cardiovascular exam is irregular with 2/6 HSM apex.  Abdominal exam nontender or distended. No masses palpated.  Extremities show no edema. neuro grossly intact    Lab Results: Basic Metabolic Panel:  Recent Labs  11/91/47 0607  NA 135  K 4.1  CL 102  CO2 24  GLUCOSE 201*  BUN 17  CREATININE 0.81  CALCIUM 8.3*   CBC:  Recent Labs  07/10/13 0455 07/11/13 0420  WBC 9.6 10.5  HGB 13.7 13.9  HCT 41.1 41.3  MCV 85.3 84.8  PLT 200 229   Cardiac Enzymes: No results found for this basename: CKTOTAL, CKMB, CKMBINDEX, TROPONINI,  in the last 72 hours   Assessment/Plan:  1. CAD:  Status post myocardial infarction.  Cath results noted. Plan medical therapy. Continue aspirin, Plavix, statin and ACE inhibitor. Carvedilol D/Ced secondary to heart block. 2 Rhythm: Patient has had type I 2nd degree AV block over the last day.  No other arrhythmias noted.  No particular symptoms; ? Related to MI; initial ECG with no inferior ST elevation and no conduction abnormality; no history of syncope.  Continue telemetry monitoring, no definite indication for PCM at this  point.  3. Acute systolic CHF: EF 82-95%, he does have some evidence for volume overload on exam and is short of breath.  - Lasix 40 mg IV bid - Increase ramipril to 5 mg daily. - Add spironolactone 12.5 mg daily.  - No beta blocker.  4. Mitral regurgitation: followup echocardiograms in the future. 5. DM  Marca Ancona 07/11/2013, 7:39 AM

## 2013-07-12 LAB — BASIC METABOLIC PANEL
CO2: 28 mEq/L (ref 19–32)
Calcium: 8.9 mg/dL (ref 8.4–10.5)
GFR calc Af Amer: 89 mL/min — ABNORMAL LOW (ref >90)
GFR calc non Af Amer: 77 mL/min — ABNORMAL LOW (ref >90)
Glucose, Bld: 284 mg/dL — ABNORMAL HIGH (ref 70–99)
Sodium: 137 mEq/L (ref 137–147)

## 2013-07-12 LAB — CBC
Hemoglobin: 14.2 g/dL (ref 13.0–17.0)
MCH: 28.7 pg (ref 26.0–34.0)
MCHC: 33.5 g/dL (ref 30.0–36.0)
Platelets: 232 10*3/uL (ref 150–400)
RBC: 4.94 MIL/uL (ref 4.22–5.81)
RDW: 12.9 % (ref 11.5–15.5)

## 2013-07-12 LAB — GLUCOSE, CAPILLARY: Glucose-Capillary: 242 mg/dL — ABNORMAL HIGH (ref 70–99)

## 2013-07-12 MED ORDER — INSULIN ASPART 100 UNIT/ML ~~LOC~~ SOLN
0.0000 [IU] | Freq: Three times a day (TID) | SUBCUTANEOUS | Status: DC
Start: 2013-07-12 — End: 2013-07-13
  Administered 2013-07-12 (×2): 3 [IU] via SUBCUTANEOUS
  Administered 2013-07-13: 2 [IU] via SUBCUTANEOUS

## 2013-07-12 MED ORDER — FUROSEMIDE 40 MG PO TABS: 40.0000 mg | ORAL_TABLET | Freq: Every day | ORAL | Status: DC

## 2013-07-12 MED ORDER — INSULIN ASPART 100 UNIT/ML ~~LOC~~ SOLN: 0.0000 [IU] | Freq: Three times a day (TID) | SUBCUTANEOUS | Status: DC

## 2013-07-12 MED ORDER — RAMIPRIL 5 MG PO CAPS
7.5000 mg | ORAL_CAPSULE | Freq: Every day | ORAL | Status: DC
  Administered 2013-07-12: 7.5 mg via ORAL
  Filled 2013-07-12 (×2): qty 1

## 2013-07-12 MED ORDER — FUROSEMIDE 40 MG PO TABS
40.0000 mg | ORAL_TABLET | Freq: Every day | ORAL | Status: DC
  Administered 2013-07-12 – 2013-07-13 (×2): 40 mg via ORAL
  Filled 2013-07-12 (×2): qty 1

## 2013-07-12 MED ORDER — METFORMIN HCL 500 MG PO TABS
500.0000 mg | ORAL_TABLET | Freq: Two times a day (BID) | ORAL | Status: DC
  Administered 2013-07-12 – 2013-07-13 (×2): 500 mg via ORAL
  Filled 2013-07-12 (×4): qty 1

## 2013-07-12 NOTE — Care Management Note (Signed)
    Page 1 of 1   07/12/2013     2:31:53 PM   CARE MANAGEMENT NOTE 07/12/2013  Patient:  DELLIS, VOGHT   Account Number:  [id number]  Date Initiated:  07/11/2013  Documentation initiated by:  Junius Creamer  Subjective/Objective Assessment:   adm w mi     Action/Plan:   lives alone   Anticipated DC Date:     Anticipated DC Plan:  HOME/SELF CARE      DC Planning Services  CM consult  PCP issues      Choice offered to / List presented to:             Status of service:   Medicare Important Message given?   (If response is "NO", the following Medicare IM given date fields will be blank) Date Medicare IM given:   Date Additional Medicare IM given:    Discharge Disposition:  HOME/SELF CARE  Per UR Regulation:  Reviewed for med. necessity/level of care/duration of stay  If discussed at Long Length of Stay Meetings, dates discussed:    Comments:  12/30 1430 debbie Hillary Struss rn,bsn gave pt phone number for [facility]. they will assist pt w pcp practices taking new pt's.

## 2013-07-12 NOTE — Progress Notes (Signed)
Chaplain requested to assist pt with advanced directive. Pt had no prior knowledge of completing advanced directive and did not have documents. Chaplain left Advanced Directive documents with pt. He is reading them now and asked for time to consider them. Chaplain consulted with RN, confirming that we cannot assist with financial power of attorney documentation, only healthcare power of attorney and living will. Chaplain asked RN to page when pt requests assistance completing HPOA/LW or has questions about document.   Guy Sandifer [place], [place] [phone] On-Call: [phone]

## 2013-07-12 NOTE — Progress Notes (Signed)
Chaplain requested to assist pt with completing advance directive. Pt was alert, competent, and understood the document. Chaplain answered all of his questions and spoke with both sons on the phone to collect their addresses at pt's request. Chaplain coordinated with CSW and identified two witnesses for the notarizing of the document. Pt was given 6 copies and RN was given a copy to add to pt's file. Pt was very appreciative.   Maurene Capes, [place] [phone]

## 2013-07-12 NOTE — Progress Notes (Signed)
CARDIAC REHAB PHASE I   PRE:  Rate/Rhythm: 70 SR  BP:  Supine:   Sitting: 151/104  Standing:    SaO2:   MODE:  Ambulation: 620 ft   POST:  Rate/Rhythm: 103 ST  BP:  Supine:   Sitting: 173/65  Standing:    SaO2:  1350-1505 Pt tolerated ambulation well without c/o of cp or SOB. BP up before and after walk. Started MI education with pt. We discussed MI, risk factors, diabetes, low carb heat healthy diet, and exercise. He states that he may have not done some things right in the past and seems open to trying to do things differently. We will follow pt tomorrow to finish education.  Melina Copa RN 07/12/2013 3:10 PM

## 2013-07-12 NOTE — Progress Notes (Signed)
Patient ID: Billy Hood, male   DOB: 11/16/1931, 79 y.o.   MRN: [id number]   Subjective:  No chest pain.  Breathing is better after IV Lasix yesterday.  Rhythm improved, still occasional type I 2nd degree AV block but less.   Objective:  Filed Vitals:   07/12/13 0000 07/12/13 0400 07/12/13 0700 07/12/13 0800  BP:  160/109 151/88 151/88  Pulse:   82   Temp: 98.3 F (36.8 C) 98.3 F (36.8 C) 98.1 F (36.7 C)   TempSrc: Oral Oral Oral   Resp:   21 26  Height:      Weight:      SpO2: 95% 95%      Intake/Output from previous day:  Intake/Output Summary (Last 24 hours) at 07/12/13 0910 Last data filed at 07/12/13 0015  Gross per 24 hour  Intake    480 ml  Output   1525 ml  Net  -1045 ml    Physical Exam: Physical exam: Well-developed well-nourished in no acute distress.  Skin is warm and dry.  HEENT is normal.  Neck is supple, JVP not elevated.  Decreased breath sounds at bases bilaterally. Cardiovascular exam is irregular with 2/6 HSM apex.  Abdominal exam nontender or distended. No masses palpated.  Extremities show no edema. neuro grossly intact    Lab Results: Basic Metabolic Panel:  Recent Labs  13/08/65 0909  NA 134*  K 4.4  CL 98  CO2 24  GLUCOSE 291*  BUN 31*  CREATININE 0.92  CALCIUM 8.9   CBC:  Recent Labs  07/11/13 0420 07/12/13 0435  WBC 10.5 7.7  HGB 13.9 14.2  HCT 41.3 42.4  MCV 84.8 85.8  PLT 229 232   Cardiac Enzymes: No results found for this basename: CKTOTAL, CKMB, CKMBINDEX, TROPONINI,  in the last 72 hours   Assessment/Plan:  1. CAD:  Status post myocardial infarction.  Cath results noted with patent grafts and no obvious culprit lesion. Plan medical therapy. Continue aspirin, Plavix, statin and ACE inhibitor. Carvedilol D/Ced secondary to heart block. 2 Rhythm: Patient has had type I 2nd degree AV block. ? Related to MI; initial ECG with no inferior ST elevation and no conduction abnormality; no history of syncope.   Rhythm improved today, still occasional Mobitz block but much less frequent.  No indication for PCM. 3. Acute systolic CHF: EF 78-46%, volume status and symptoms better after IV Lasix yesterday. - Transition Lasix to 40 mg po daily. - Increase ramipril to 7.5 mg daily with elevated BP. - Continuespironolactone 12.5 mg daily.  - No beta blocker with heart block. - Await BMET today.   4. Mitral regurgitation: followup echocardiograms in the future. 5. DM 6. May go to floor, if stable home tomorrow.   Marca Ancona 07/12/2013, 9:10 AM

## 2013-07-12 NOTE — Progress Notes (Signed)
Spoke with patient about diabetes and home regimen for diabetes.  According to the patient he was told he had diabetes about 17 years ago and he was initially started on insulin at that time.  He reports that he stopped seeing his PCP about 7 years ago and stopped taking insulin.  He reports that he made a decision then to eat better, exercise, and lose weight.  He feels that the changes he has made over the past 7 years has kept the diabetes managed without any medications for diabetes.  Discussed A1C results (8.7% on 07/07/13) and explained what an A1C is, basic pathophysiology of DM Type 2, basic home care, importance of checking CBGs and maintaining good CBG control to prevent long-term and short-term complications.  Patient reports that he does not check his blood sugar and has not done so in years.  Stressed importance of monitoring blood glucose to ensure that diabetes is kept in control.  Discussed impact of exercise, stress, sickness, and nutrition.  Inquired about whether patient follows a diabetic diet and he reports that he drinks mainly water and oriental/herbal teas and he eats a lot of starchy foods.  Inquired about knowledge about carb modified diet.  Patient reports that he does not know a lot about a carb modified diet.  Discussed counting carbs and eating carbs in moderation.  Will consult RD for diet education.    Informed patient that while inpatient his blood sugar would be monitored and he would be receiving insulin for correction of blood glucose.  Patient states "That is fine.  I am okay with the doctor doing what they feel needs to be done.  But, I do not want to take any medications when I go home if I don't have to."  Explained to the patient that he will likely need medication for diabetes management since his A1C was 8.7%.  Patient verbalized understanding of information discussed and states that he does not have any further questions at this time.  At time of discharge, patient  will need a prescription for a glucometer and a PCP for follow up.  Will continue to follow as an inpatient.  Thanks, Orlando Penner, RN, MSN, CCRN Diabetes Coordinator Inpatient Diabetes Program [phone] (Team Pager) [phone] (AP office) [phone] [facility] office)

## 2013-07-12 NOTE — Plan of Care (Signed)
Problem: Food- and Nutrition-Related Knowledge Deficit (NB-1.1) Goal: Nutrition education Formal process to instruct or train a patient/client in a skill or to impart knowledge to help patients/clients voluntarily manage or modify food choices and eating behavior to maintain or improve health. Outcome: Progressing  RD consulted for nutrition education regarding diabetes.     Lab Results  Component Value Date    HGBA1C 8.7* 07/07/2013    Pt in with RN going over education materials.  RN is reviewing home diet with pt.  RD provided "Carbohydrate Counting for People with Diabetes" handout from the Academy of Nutrition and Dietetics. Stated availability for ongoing education with patient.   Body mass index is 28.86 kg/(m^2). Pt meets criteria for overweight based on current BMI.  Current diet order is CHO Mod Medium, patient is consuming approximately 75-100% of meals at this time. Labs and medications reviewed. No further nutrition interventions warranted at this time. RD contact information provided. If additional nutrition issues arise, please re-consult RD.  Loyce Dys, MS RD LDN Clinical Inpatient Dietitian Pager: [phone] Weekend/After hours pager: [phone]

## 2013-07-12 NOTE — Progress Notes (Signed)
Inpatient Diabetes Program Recommendations  AACE/ADA: New Consensus Statement on Inpatient Glycemic Control (2013)  Target Ranges:  Prepandial:   less than 140 mg/dL      Peak postprandial:   less than 180 mg/dL (1-2 hours)      Critically ill patients:  140 - 180 mg/dL  Results for ALFONS, SULKOWSKI (MRN [id number]) as of 07/12/2013 10:14  Ref. Range 07/07/2013 15:30  Hemoglobin A1C Latest Range: <5.7 % 8.7 (H)   Results for YON, SCHIFFMAN (MRN [id number]) as of 07/12/2013 10:14  Ref. Range 07/07/2013 13:04 07/08/2013 04:55 07/09/2013 06:07 07/11/2013 09:09  Glucose Latest Range: 70-99 mg/dL 782 (H) 956 (H) 213 (H) 291 (H)    Inpatient Diabetes Program Recommendations Correction (SSI): Please order CBGs with Novolog correction scale ACHS.  Note: Noted in the H&P, patient has a history of diabetes and initial lab glucose was 183 mg/dl on 08/65 @ 78:46.  Fasting lab glucose has consistently been over 200 mg/dl over the past 3 days and A1C was 8.7% on 07/07/13.  Please order CBGs with Novolog correction scale ACHS.  Did not see any medications on the home medication list for diabetes management.  If patient is not on any medication for diabetes, MD will need to address before discharge.  Will continue to follow.  Thanks, Orlando Penner, RN, MSN, CCRN Diabetes Coordinator Inpatient Diabetes Program [phone] (Team Pager) [phone] (AP office) [phone] [facility] office)

## 2013-07-12 NOTE — Progress Notes (Signed)
Clinical Social Work Department BRIEF PSYCHOSOCIAL ASSESSMENT 07/12/2013  Patient:  Billy Hood, Billy Hood     Account Number:  [id number]     Admit date:  07/07/2013  Clinical Social Worker:  Varney Biles  Date/Time:  07/12/2013 08:36 AM  Referred by:  Physician  Date Referred:  07/12/2013 Referred for  Advanced Directives   Other Referral:   Interview type:  Other - See comment Other interview type:   RN    PSYCHOSOCIAL DATA Living Status:  ALONE Admitted from facility:   Level of care:   Primary support name:  Jesstin, [place] ([phone]) Primary support relationship to patient:  CHILD, ADULT Degree of support available:   Unassessed    CURRENT CONCERNS Current Concerns  Other - See comment   Other Concerns:   Power of attorney paperwork    SOCIAL WORK ASSESSMENT / PLAN CSW received consult: medical power of attorney paperwork. CSW called pt's RN and RN explained son has filled out paperwork and wants someone to go through it with him and assist him with getting witness signatures. CSW paged spiritual care and provided message to chaplain for next available chaplain to assist with this. CSW signing off.   Assessment/plan status:  No Further Intervention Required Other assessment/ plan:   Information/referral to community resources:   Chaplain assisting pt    PATIENT'S/FAMILY'S RESPONSE TO PLAN OF CARE: N/A       Maryclare Labrador, MSW, [facility] Clinical Social Worker [phone]

## 2013-07-13 ENCOUNTER — Encounter (HOSPITAL_COMMUNITY): Payer: Self-pay | Admitting: Nurse Practitioner

## 2013-07-13 ENCOUNTER — Telehealth: Payer: Self-pay | Admitting: Cardiology

## 2013-07-13 DIAGNOSIS — I340 Nonrheumatic mitral (valve) insufficiency: Secondary | ICD-10-CM

## 2013-07-13 DIAGNOSIS — R001 Bradycardia, unspecified: Secondary | ICD-10-CM

## 2013-07-13 LAB — BASIC METABOLIC PANEL
Calcium: 8.3 mg/dL — ABNORMAL LOW (ref 8.4–10.5)
Chloride: 99 mEq/L (ref 96–112)
Creatinine, Ser: 0.85 mg/dL (ref 0.50–1.35)
GFR calc Af Amer: >90 mL/min (ref >90)

## 2013-07-13 LAB — CBC
HCT: 39.3 % (ref 39.0–52.0)
MCH: 28.8 pg (ref 26.0–34.0)
MCHC: 34.1 g/dL (ref 30.0–36.0)
MCV: 84.5 fL (ref 78.0–100.0)
Platelets: 229 10*3/uL (ref 150–400)
RDW: 12.8 % (ref 11.5–15.5)
WBC: 6 10*3/uL (ref 4.0–10.5)

## 2013-07-13 LAB — GLUCOSE, CAPILLARY: Glucose-Capillary: 171 mg/dL — ABNORMAL HIGH (ref 70–99)

## 2013-07-13 MED ORDER — FUROSEMIDE 20 MG PO TABS: 20.0000 mg | ORAL_TABLET | Freq: Every day | ORAL | Status: DC

## 2013-07-13 MED ORDER — RAMIPRIL 10 MG PO CAPS
10.0000 mg | ORAL_CAPSULE | Freq: Every day | ORAL | Status: DC
  Administered 2013-07-13: 10 mg via ORAL
  Filled 2013-07-13: qty 1

## 2013-07-13 MED ORDER — ATORVASTATIN CALCIUM 80 MG PO TABS: 80.0000 mg | ORAL_TABLET | Freq: Every day | ORAL | Status: DC

## 2013-07-13 MED ORDER — RAMIPRIL 10 MG PO CAPS: 10.0000 mg | ORAL_CAPSULE | Freq: Every day | ORAL | Status: DC

## 2013-07-13 MED ORDER — NITROGLYCERIN 0.4 MG SL SUBL: 0.4000 mg | SUBLINGUAL_TABLET | SUBLINGUAL | Status: DC | PRN

## 2013-07-13 MED ORDER — SPIRONOLACTONE 25 MG PO TABS: 12.5000 mg | ORAL_TABLET | Freq: Every day | ORAL | Status: DC

## 2013-07-13 MED ORDER — CLOPIDOGREL BISULFATE 75 MG PO TABS: 75.0000 mg | ORAL_TABLET | Freq: Every day | ORAL | Status: DC

## 2013-07-13 MED ORDER — METFORMIN HCL 500 MG PO TABS: 500.0000 mg | ORAL_TABLET | Freq: Two times a day (BID) | ORAL | Status: DC

## 2013-07-13 MED ORDER — ASPIRIN 81 MG PO TABS: 81.0000 mg | ORAL_TABLET | Freq: Every day | ORAL | Status: DC

## 2013-07-13 NOTE — Telephone Encounter (Signed)
New problem    7 days TCM with Billy Hood E appt is 1/7 @ 1 pm per chris b app

## 2013-07-13 NOTE — Progress Notes (Signed)
[id number] Cardiac Rehab Completed MI education with pt. He voices understanding. Pt states that he is willing to start taking the medications,but he will not stay on them long term. I discussed the important of each of them, but I seriously not compliance.He does agrees to [employer]. CRP in [place], will send referral.  Beatrix Fetters, RN 07/13/2013 10:17 AM

## 2013-07-13 NOTE — Discharge Summary (Signed)
Discharge Summary   Patient ID: Billy Hood,  MRN: [id number], DOB/AGE: 77-Mar-1933 77 y.o.  Admit date: 07/07/2013 Discharge date: 07/13/2013  Primary Care Provider: No PCP Per Patient Primary Cardiologist: Lovena Neighbours, MD   Discharge Diagnoses Principal Problem:   NSTEMI (non-ST elevated myocardial infarction)  **S/P catheterization this admission revealing native multivessel CAD with 4/4 patent grafts-->Medical Therapy initiated.  Active Problems:   CAD (coronary artery disease)   Acute on chronic systolic CHF (congestive heart failure)  **Discharge weight of 195 lbs.  Net negative diuresis of 674.6 ml this admission.   Cardiomyopathy, ischemic  **EF 40-45% by echocardiogram this admission.   Diabetes mellitus without complication  **A1c = 8.7 this admission.  **Metformin initiated.   Hypertension   Mobitz type 1 second degree atrioventricular block/Bradycardia  **Beta-blocker discontinued.   Moderate mitral regurgitation   Noncompliance  Allergies No Known Allergies  Procedures  Cardiac Catheterization 12.26.2014  Hemodynamic Findings: Central aortic pressure: 124/67 Left ventricular pressure: 121/15/32  Angiographic Findings:  Left main: 60% ostial stenosis.   Left Anterior Descending Artery: Large caliber vessel that courses to the apex. The proximal vessel has diffuse 50% stenosis. The mid vessel has 100% occlusion. The mid and distal vessel fills from the patent IMA graft. The diagonal branch fills from the patent vein graft and from antegrade flow.   Circumflex Artery: Moderate caliber vessel with 50% ostial stenosis, 100% mid occlusion. The moderate caliber obtuse marginal branch is seen to fill from the patent vein graft.   Right Coronary Artery: Large dominant vessel with diffuse 30% stenosis in the proximal, mid and distal vessel. The PDA fills from antegrade flow and from the patent vein graft.   Graft Anatomy:   SVG to Diagonal is patent SVG to PDA is  patent SVG to OM is patent LIMA to mid LAD is patent  Left Ventricular Angiogram: Deferred.   Impression: 1. Triple vessel CAD s/p CABG with 4/4 patent bypass grafts 2. NSTEMI, no obvious culprit. He could have had restoration of flow in an occluded vessel after heparin drip was started. Most likely a small branch vessel has become occluded and is not obvious on angiography.   _____________   2D Echocardiogram Study Conclusions  - Left ventricle: The cavity size was severely dilated. Wall   thickness was normal. Systolic function was mildly to   moderately reduced. The estimated ejection fraction was in   the range of 40% to 45%. Regional wall motion   abnormalities cannot be excluded. Features are consistent   with a pseudonormal left ventricular filling pattern, with   concomitant abnormal relaxation and increased filling   pressure (grade 2 diastolic dysfunction). - Mitral valve: Moderate regurgitation. - Left atrium: The atrium was mildly dilated. - Right atrium: The atrium was mildly dilated. - Atrial septum: No defect or patent foramen ovale was   identified. _____________   History of Present Illness  79 year old male with prior history of coronary artery disease status post coronary artery bypass grafting x4 in April of 2003. He also has a history of hypertension and diabetes mellitus however has been off of all of his medications for approximately 5 years. He was in his usual state of health until approximately 24 hours prior to admission when he began to experience intermittent left-sided chest pressure with left arm tingling. Patient presented to the [facility] on December 25, where ECG showed lateral T-wave inversion and troponin was markedly elevated. Patient was admitted for further evaluation and management of non-ST segment  elevation myocardial infarction.  Hospital Course  Patient eventually peaked his troponin at greater than 20. He was initially placed on aspirin,  beta blocker, statin, and heparin therapy. Decision was made to proceed with diagnostic cardiac catheterization and this was performed on December 26 revealing native multivessel coronary artery disease with 4 of 4 patent grafts. Continue medical therapy was recommended and Plavix was added to his medication regimen. He was noted on telemetry to exhibit bradycardia with Mobitz 1 heart block and as a result of beta blocker therapy was discontinued. Echocardiography was performed and showed an EF of 40-45% with grade 2 diastolic dysfunction. ACE inhibitor therapy was initiated.  Post catheterization, patient reported dyspnea with exertion. He was felt to have volume overload on exam and was initiated on intravenous Lasix on December 29. Low-dose spironolactone therapy was also added to his regimen. With diuresis, he did have symptomatic improvement and reduction in weight to 195 pounds from 204 pounds on admission. Diuretic therapy was transitioned to an oral regimen. His rhythm remained stable off of beta blocker therapy though he did continue to have intermittent asymptomatic Mobitz one heart block.  Patient has a known history of diabetes mellitus and his hemoglobin A1c was 8.7 during this admission. We have initiated metformin therapy and have recommended that he obtain and followup with a primary care provider for further management of his diabetes.  Billy Hood has been seen by cardiac rehabilitation and has been a meal without recurrent symptoms or limitations. He has been counseled on the importance of medication compliance and he will be discharged home today in good condition.  Discharge Vitals Blood pressure 134/69, pulse 79, temperature 98 F (36.7 C), temperature source Oral, resp. rate 20, height 5\' 9"  (1.753 m), weight 195 lb 8.8 oz (88.7 kg), SpO2 94.00%.  Filed Weights   07/07/13 0924 07/07/13 1445  Weight: 204 lb (92.534 kg) 195 lb 8.8 oz (88.7 kg)   Labs  CBC  Recent Labs   07/12/13 0435 07/13/13 0510  WBC 7.7 6.0  HGB 14.2 13.4  HCT 42.4 39.3  MCV 85.8 84.5  PLT 232 229   Basic Metabolic Panel  Recent Labs  07/12/13 1040 07/13/13 0510  NA 137 139  K 4.2 3.9  CL 96 99  CO2 28 27  GLUCOSE 284* 164*  BUN 26* 21  CREATININE 0.96 0.85  CALCIUM 8.9 8.3*   Cardiac Enzymes Lab Results  Component Value Date   TROPONINI >20.00* 07/08/2013   Hemoglobin A1C Lab Results  Component Value Date   HGBA1C 8.7* 07/07/2013   Fasting Lipid Panel Lab Results  Component Value Date   CHOL 160 07/08/2013   HDL 41 07/08/2013   LDLCALC 102* 07/08/2013   TRIG 84 07/08/2013   CHOLHDL 3.9 07/08/2013   Thyroid Function Tests Lab Results  Component Value Date   TSH 1.290 07/07/2013    Disposition  Pt is being discharged home today in good condition.  Follow-up Plans & Appointments  Follow-up Information   Follow up with Rick Duff, PA-C On 07/20/2013. (1:00 PM)    Specialty:  Cardiology   Contact information:   [address] [place] [address] [phone]       Follow up with You will need to obtain a primary care provider In 1 week.     Discharge Medications    Medication List         acetaminophen 500 MG tablet  Commonly known as:  TYLENOL  Take 500  mg by mouth every 6 (six) hours as needed for mild pain.     aspirin 81 MG tablet  Take 1 tablet (81 mg total) by mouth daily.     atorvastatin 80 MG tablet  Commonly known as:  LIPITOR  Take 1 tablet (80 mg total) by mouth daily at 6 PM.     clopidogrel 75 MG tablet  Commonly known as:  PLAVIX  Take 1 tablet (75 mg total) by mouth daily with breakfast.     Fish Oil 1200 MG Caps  Take 1 capsule by mouth 2 (two) times daily.     furosemide 20 MG tablet  Commonly known as:  LASIX  Take 1 tablet (20 mg total) by mouth daily.     metFORMIN 500 MG tablet  Commonly known as:  GLUCOPHAGE  Take 1 tablet (500 mg total) by mouth 2 (two) times daily with a meal.       nitroGLYCERIN 0.4 MG SL tablet  Commonly known as:  NITROSTAT  Place 1 tablet (0.4 mg total) under the tongue every 5 (five) minutes x 3 doses as needed for chest pain.     ramipril 10 MG capsule  Commonly known as:  ALTACE  Take 1 capsule (10 mg total) by mouth daily.     spironolactone 25 MG tablet  Commonly known as:  ALDACTONE  Take 0.5 tablets (12.5 mg total) by mouth daily.       Outstanding Labs/Studies  BMET at f/u appt.  Duration of Discharge Encounter   Greater than 30 minutes including physician time.  Signed, Nicolasa Ducking NP 07/13/2013, 9:41 AM

## 2013-07-13 NOTE — Progress Notes (Addendum)
Patient ID: Billy Hood, male   DOB: 03-Jul-1932, 79 y.o.   MRN: [id number] P  Subjective:  No chest pain or dyspnea.  Rhythm improved, still occasional type I 2nd degree AV block but less.  Marland Kitchen aspirin  81 mg Oral Daily  . atorvastatin  80 mg Oral q1800  . clopidogrel  75 mg Oral Q breakfast  . furosemide  40 mg Oral Daily  . heparin subcutaneous  5,000 Units Subcutaneous Q8H  . insulin aspart  0-9 Units Subcutaneous TID WC  . metFORMIN  500 mg Oral BID WC  . omega-3 acid ethyl esters  1 g Oral BID  . ramipril  10 mg Oral Daily  . spironolactone  12.5 mg Oral Daily      Objective:  Filed Vitals:   07/12/13 0800 07/12/13 1139 07/12/13 2036 07/13/13 0545  BP: 151/88 155/83 141/69 134/69  Pulse:  77 75 79  Temp:  97.7 F (36.5 C) 98.2 F (36.8 C) 98 F (36.7 C)  TempSrc:  Oral    Resp: 26 21 20 20   Height:      Weight:      SpO2:  98% 91% 94%    Intake/Output from previous day: No intake or output data in the 24 hours ending 07/13/13 4098  Physical Exam: Physical exam: Well-developed well-nourished in no acute distress.  Skin is warm and dry.  HEENT is normal.  Neck is supple, JVP not elevated.  Decreased breath sounds at bases bilaterally. Cardiovascular exam is irregular with 2/6 HSM apex.  Abdominal exam nontender or distended. No masses palpated.  Extremities show no edema. neuro grossly intact    Lab Results: Basic Metabolic Panel:  Recent Labs  11/91/47 1040 07/13/13 0510  NA 137 139  K 4.2 3.9  CL 96 99  CO2 28 27  GLUCOSE 284* 164*  BUN 26* 21  CREATININE 0.96 0.85  CALCIUM 8.9 8.3*   CBC:  Recent Labs  07/12/13 0435 07/13/13 0510  WBC 7.7 6.0  HGB 14.2 13.4  HCT 42.4 39.3  MCV 85.8 84.5  PLT 232 229   Cardiac Enzymes: No results found for this basename: CKTOTAL, CKMB, CKMBINDEX, TROPONINI,  in the last 72 hours   Assessment/Plan:  1. CAD:  Status post NSTEMI.  Cath results noted with patent grafts and no obvious culprit  lesion. Plan medical therapy. Continue aspirin, Plavix, statin and ACE inhibitor. Carvedilol D/Ced secondary to heart block. 2 Rhythm: Patient has had type I 2nd degree AV block. ? Related to MI; initial ECG with no inferior ST elevation and no conduction abnormality; no history of syncope.  Rhythm stable last 2 days, still occasional Mobitz block but much less frequent.  No indication for PCM. 3. Acute systolic CHF: EF 82-95%, volume status and symptoms better after IV Lasix. - Can go home on Lasix 20 mg daily. - Increase ramipril to 10 mg daily with elevated BP. - Continue spironolactone 12.5 mg daily.  - No beta blocker with heart block. - K/creatinine stable.   4. Mitral regurgitation: followup echocardiograms in the future. 5. DM: Had not been on meds.  Metformin started.  6. May go home today.  Needs followup with cardiology in 2 wks.  Needs to establish PCP to follow diabetes.  Home meds: metformin 500 mg bid, Plavix 75, ASA 81, atorva 80, ramipril 10 daily, spironolactone 12.5 daily, lasix 20 daily.  BMET at followup appointment.   Marca Ancona 07/13/2013, 8:24 AM

## 2013-07-15 NOTE — Telephone Encounter (Signed)
TCM pt.  States he is doing good.  Denies any SOB or CP.  Understands the dosages of all his medications.  Aware of appointment with Nehemiah SettleBrooke on 1/7. Does not have any questions regarding his discharge instructions. Will call if has any questions or concerns.

## 2013-07-20 ENCOUNTER — Encounter: Payer: Self-pay | Admitting: Cardiology

## 2013-07-20 ENCOUNTER — Ambulatory Visit (INDEPENDENT_AMBULATORY_CARE_PROVIDER_SITE_OTHER): Payer: Medicare Other | Admitting: Cardiology

## 2013-07-20 VITALS — BP 148/88 | HR 63 | Ht 69.0 in | Wt 200.8 lb

## 2013-07-20 DIAGNOSIS — I5022 Chronic systolic (congestive) heart failure: Secondary | ICD-10-CM

## 2013-07-20 DIAGNOSIS — I2510 Atherosclerotic heart disease of native coronary artery without angina pectoris: Secondary | ICD-10-CM

## 2013-07-20 DIAGNOSIS — I2589 Other forms of chronic ischemic heart disease: Secondary | ICD-10-CM

## 2013-07-20 DIAGNOSIS — R001 Bradycardia, unspecified: Secondary | ICD-10-CM

## 2013-07-20 DIAGNOSIS — I255 Ischemic cardiomyopathy: Secondary | ICD-10-CM

## 2013-07-20 DIAGNOSIS — I214 Non-ST elevation (NSTEMI) myocardial infarction: Secondary | ICD-10-CM

## 2013-07-20 DIAGNOSIS — I498 Other specified cardiac arrhythmias: Secondary | ICD-10-CM

## 2013-07-20 LAB — BASIC METABOLIC PANEL
BUN: 24 mg/dL — AB (ref 6–23)
CO2: 29 mEq/L (ref 19–32)
CREATININE: 1 mg/dL (ref 0.4–1.5)
Calcium: 9 mg/dL (ref 8.4–10.5)
Chloride: 101 mEq/L (ref 96–112)
GFR: 80.27 mL/min (ref >60.00)
Glucose, Bld: 145 mg/dL — ABNORMAL HIGH (ref 70–99)
Potassium: 4.8 mEq/L (ref 3.5–5.1)
Sodium: 138 mEq/L (ref 135–145)

## 2013-07-20 NOTE — Patient Instructions (Signed)
Your physician recommends that you schedule a follow-up appointment in: WITH DR. KATZ IN 4 WEEKS  Your physician recommends that you return for lab work in: TODAY BMET  Your physician recommends that you continue on your current medications as directed. Please refer to the Current Medication list given to you today.

## 2013-07-20 NOTE — Progress Notes (Signed)
Patient ID: Billy Hood MRN: [id number], DOB/AGE: [birth year]   Date of Visit: 07/20/2013  Primary Physician: No PCP Per Patient Primary Cardiologist: Myrtis Ser, MD Reason for Visit: Hospital follow-up  History of Present Illness  Billy Hood is a 80 y.o. male with coronary artery disease status post coronary artery bypass grafting x4 in April of 2003. He also has a history of hypertension and diabetes mellitus however has been off of all of his medications for approximately 5 years. He was admitted with chest pain on 07/07/2013 and ruled in for NSTEMI with troponin >20. He was placed on aspirin, BB, statin and IV heparin. He underwent cardiac catheterization revealing native multivessel coronary artery disease with 4 of 4 grafts patent. Medical therapy was recommended and Plavix was added to his medication regimen. He was noted on telemetry to exhibit bradycardia with Mobitz 1 AV block and as a result of beta blocker therapy was discontinued. Echocardiogram was done and showed an EF of 40-45% with grade 2 diastolic dysfunction. ACE inhibitor and spironolactone were initiated in addition to diuretic therapy. In addition, he was started on metformin for diabetes management and instructed to establish care with PCP.   He presents today for hospital followup. Since discharge, he reports he is doing well and has no complaints. He politely expresses his belief that "he can take care of himself" and doesn't believe in taking medications chronically. He did not establish care with PCP and states he doesn't intend to. He denies chest pain or shortness of breath. He denies palpitations, dizziness, near syncope or syncope. He denies LE swelling, orthopnea, PND or recent weight gain. He is compliant with daily weights and states his weight at home has been "without variation." He reports compliance with medications.  Past Medical History Past Medical History  Diagnosis Date  . CAD (coronary artery disease)  10/2001    a. 10/2001 CABG x 4: LIMA to LAD, SVG to PDA, CX and Diagnonals;  b. 06/2013 NSTEMI/Cath: Lm 60ost, LAD 141m, LCX 185m, RCA 30 diff, VG->Diag nl, VG->PDA nl, VG->OM nl, LIMA->LAD nl.  . Diabetes mellitus without complication     a. Dx 2003  . Hypertension   . Hydrocele     a. Excision 12/2001  . Moderate mitral regurgitation   . Mobitz type 1 second degree atrioventricular block     a. noted on admission 06/2013 ->coreg d/c'd.  . Cardiomyopathy, ischemic     a. 06/2013 Echo: EF 40-45%, Gr2 DD, Mod MR, mild biatrial enlargement.  . Bradycardia   . Chronic systolic CHF (congestive heart failure)     a. 06/2013 EF 40-45% by echo.    Past Surgical History Past Surgical History  Procedure Laterality Date  . Cardiac surgery    . Coronary artery bypass graft  10/2001  . Coronary stent placement  1999    LAD    Allergies/Intolerances No Known Allergies  Current Home Medications Current Outpatient Prescriptions  Medication Sig Dispense Refill  . acetaminophen (TYLENOL) 500 MG tablet Take 500 mg by mouth every 6 (six) hours as needed for mild pain.      Marland Kitchen aspirin 81 MG tablet Take 1 tablet (81 mg total) by mouth daily.      Marland Kitchen atorvastatin (LIPITOR) 80 MG tablet Take 1 tablet (80 mg total) by mouth daily at 6 PM.  30 tablet  6  . clopidogrel (PLAVIX) 75 MG tablet Take 1 tablet (75 mg total) by mouth daily with breakfast.  30 tablet  6  .  furosemide (LASIX) 20 MG tablet Take 1 tablet (20 mg total) by mouth daily.  30 tablet  6  . metFORMIN (GLUCOPHAGE) 500 MG tablet Take 1 tablet (500 mg total) by mouth 2 (two) times daily with a meal.  60 tablet  3  . nitroGLYCERIN (NITROSTAT) 0.4 MG SL tablet Place 1 tablet (0.4 mg total) under the tongue every 5 (five) minutes x 3 doses as needed for chest pain.  25 tablet  3  . Omega-3 Fatty Acids (FISH OIL) 1200 MG CAPS Take 1 capsule by mouth 2 (two) times daily.      . ramipril (ALTACE) 10 MG capsule Take 1 capsule (10 mg total) by mouth  daily.  30 capsule  6  . spironolactone (ALDACTONE) 25 MG tablet Take 0.5 tablets (12.5 mg total) by mouth daily.  30 tablet  3   No current facility-administered medications for this visit.    Social History History   Social History  . Marital Status: Unknown    Spouse Name: N/A    Number of Children: N/A  . Years of Education: N/A   Occupational History  . Not on file.   Social History Main Topics  . Smoking status: Former Smoker    Quit date: 07/07/1961  . Smokeless tobacco: Not on file  . Alcohol Use: Yes     Comment: occasionally  . Drug Use: Not on file  . Sexual Activity: Not on file   Other Topics Concern  . Not on file   Social History Narrative  . No narrative on file     Review of Systems General: No chills, fever, night sweats or weight changes Cardiovascular: No chest pain, dyspnea on exertion, edema, orthopnea, palpitations, paroxysmal nocturnal dyspnea Dermatological: No rash, lesions or masses Respiratory: No cough, dyspnea Urologic: No hematuria, dysuria Abdominal: No nausea, vomiting, diarrhea, bright red blood per rectum, melena, or hematemesis Neurologic: No visual changes, weakness, changes in mental status All other systems reviewed and are otherwise negative except as noted above.  Physical Exam Vitals: Blood pressure 148/88, pulse 63, height 5\' 9"  (1.753 m), weight 200 lb 12.8 oz (91.082 kg), SpO2 99.00%.  General: Well developed, well appearing 80 y.o. male in no acute distress. HEENT: Normocephalic, atraumatic. EOMs intact. Sclera nonicteric. Oropharynx clear.  Neck: Supple. No JVD. Lungs: Respirations regular and unlabored, CTA bilaterally. No wheezes, rales or rhonchi. Heart: RRR. S1, S2 present. No murmurs, rub, S3 or S4. Abdomen: Soft, non-distended.  Extremities: No clubbing, cyanosis or edema. PT/Radials 2+ and equal bilaterally. Psych: Normal affect. Neuro: Alert and oriented X 3. Moves all extremities spontaneously.     Diagnostics  Echocardiogram Dec 2014 Study Conclusions - Left ventricle: The cavity size was severely dilated. Wall thickness was normal. Systolic function was mildly to moderately reduced. The estimated ejection fraction was in the range of 40% to 45%. Regional wall motion abnormalities cannot be excluded. Features are consistent with a pseudonormal left ventricular filling pattern, with concomitant abnormal relaxation and increased filling pressure (grade 2 diastolic dysfunction). - Mitral valve: Moderate regurgitation. - Left atrium: The atrium was mildly dilated. - Right atrium: The atrium was mildly dilated. - Atrial septum: No defect or patent foramen ovale was Identified.  Cardiac catheterization 07/08/2013 Hemodynamic Findings:  Central aortic pressure: 124/67  Left ventricular pressure: 121/15/32  Angiographic Findings:  Left main: 60% ostial stenosis.  Left Anterior Descending Artery: Large caliber vessel that courses to the apex. The proximal vessel has diffuse 50% stenosis. The mid vessel has  100% occlusion. The mid and distal vessel fills from the patent IMA graft. The diagonal branch fills from the patent vein graft and from antegrade flow.  Circumflex Artery: Moderate caliber vessel with 50% ostial stenosis, 100% mid occlusion. The moderate caliber obtuse marginal branch is seen to fill from the patent vein graft.  Right Coronary Artery: Large dominant vessel with diffuse 30% stenosis in the proximal, mid and distal vessel. The PDA fills from antegrade flow and from the patent vein graft.  Graft Anatomy:  SVG to Diagonal is patent  SVG to PDA is patent  SVG to OM is patent  LIMA to mid LAD is patent  Left Ventricular Angiogram: Deferred.  Impression:  1. Triple vessel CAD s/p CABG with 4/4 patent bypass grafts  2. NSTEMI, no obvious culprit. He could have had restoration of flow in an occluded vessel after heparin drip was started. Most likely a small branch vessel  has become occluded and is not obvious on angiography.   12-lead ECG today - NSR at 69 bpm with normal intervals; no ST-T wave abnormalities; PR 178, QRS 94, QT/QTc 386/413  Assessment and Plan 1. CAD s/p recent NSTEMI, prior CABG - recent cath with multivessel CAD and 4/4 grafts patent; medical therapy recommended - stable without anginal symptoms - continue medical therapy with ASA, Plavix, statin and ACEI - no BB due to bradycardia - counseled regarding the importance of medication compliance and encouraged him to establish care with PCP - return for follow-up with Dr. Myrtis SerKatz in 4 weeks 2. Ischemic CM, EF 40-45% - continue medical therapy with ACEI, spironolactone and and Lasix - check BMET today 3. Bradycardia - resolved off BB; ECG today shows NSR  Signed, Chevon Fomby, PA-C 07/20/2013, 1:49 PM

## 2013-07-21 ENCOUNTER — Telehealth: Payer: Self-pay | Admitting: *Deleted

## 2013-07-21 NOTE — Telephone Encounter (Signed)
lmtcb for lab results

## 2013-07-25 ENCOUNTER — Telehealth: Payer: Self-pay | Admitting: *Deleted

## 2013-07-25 NOTE — Telephone Encounter (Signed)
lmtcb for lab results, number provided 

## 2013-08-17 ENCOUNTER — Encounter: Payer: Self-pay | Admitting: Cardiology

## 2013-08-17 DIAGNOSIS — I441 Atrioventricular block, second degree: Secondary | ICD-10-CM | POA: Insufficient documentation

## 2013-08-17 DIAGNOSIS — I5022 Chronic systolic (congestive) heart failure: Secondary | ICD-10-CM | POA: Insufficient documentation

## 2013-08-17 DIAGNOSIS — I255 Ischemic cardiomyopathy: Secondary | ICD-10-CM | POA: Insufficient documentation

## 2013-08-17 DIAGNOSIS — R9430 Abnormal result of cardiovascular function study, unspecified: Secondary | ICD-10-CM | POA: Insufficient documentation

## 2013-08-19 ENCOUNTER — Ambulatory Visit (INDEPENDENT_AMBULATORY_CARE_PROVIDER_SITE_OTHER): Payer: Medicare Other | Admitting: Cardiology

## 2013-08-19 ENCOUNTER — Encounter: Payer: Self-pay | Admitting: Cardiology

## 2013-08-19 VITALS — BP 186/111 | HR 97 | Ht 69.0 in | Wt 204.0 lb

## 2013-08-19 DIAGNOSIS — I498 Other specified cardiac arrhythmias: Secondary | ICD-10-CM

## 2013-08-19 DIAGNOSIS — E119 Type 2 diabetes mellitus without complications: Secondary | ICD-10-CM

## 2013-08-19 DIAGNOSIS — I2589 Other forms of chronic ischemic heart disease: Secondary | ICD-10-CM

## 2013-08-19 DIAGNOSIS — I059 Rheumatic mitral valve disease, unspecified: Secondary | ICD-10-CM

## 2013-08-19 DIAGNOSIS — I10 Essential (primary) hypertension: Secondary | ICD-10-CM

## 2013-08-19 DIAGNOSIS — I340 Nonrheumatic mitral (valve) insufficiency: Secondary | ICD-10-CM

## 2013-08-19 DIAGNOSIS — I255 Ischemic cardiomyopathy: Secondary | ICD-10-CM

## 2013-08-19 DIAGNOSIS — I441 Atrioventricular block, second degree: Secondary | ICD-10-CM

## 2013-08-19 DIAGNOSIS — R001 Bradycardia, unspecified: Secondary | ICD-10-CM

## 2013-08-19 DIAGNOSIS — I2510 Atherosclerotic heart disease of native coronary artery without angina pectoris: Secondary | ICD-10-CM

## 2013-08-19 DIAGNOSIS — I509 Heart failure, unspecified: Secondary | ICD-10-CM

## 2013-08-19 DIAGNOSIS — I5022 Chronic systolic (congestive) heart failure: Secondary | ICD-10-CM

## 2013-08-19 NOTE — Patient Instructions (Signed)
Continue same medication    Check blood pressure at home with a arm cuff let us know if blood pressure is elevated     Your physician wants you to follow-up in: 3 months. You will receive a reminder letter in the mail two months in advance. If you don't receive a letter, please call our office to schedule the follow-up appointment.

## 2013-08-19 NOTE — Assessment & Plan Note (Signed)
His rhythm has stabilized since he was in the hospital. Over time I will decide if we can readmit a small dose of a beta blocker.  As part of today's evaluation I spent greater than 25 minutes with his total care. More than half of the 25 minutes has been spent with direct contact with the patient. We had a full discussion about his hospitalization and his post hospital care.

## 2013-08-19 NOTE — Assessment & Plan Note (Signed)
His diabetes is being treated. No change in therapy.

## 2013-08-19 NOTE — Assessment & Plan Note (Signed)
He does have some reduction in LV function. He is on a diuretic, ACE inhibitor, and spironolactone. We are not using a beta blocker because of his bradycardia. Over time I may consider trying to resume a beta blocker.

## 2013-08-19 NOTE — Assessment & Plan Note (Signed)
His volume status is stable on his current meds. No change in therapy.

## 2013-08-19 NOTE — Progress Notes (Signed)
HPI  Patient is seen today to followup coronary disease and left ventricular dysfunction. He is also seen to followup his hospital visit he and other office visits by other providers since that time. I met the patient first in the hospital in the emergency room on July 07, 2013. He had an elevated troponin. After an extensive amount of discussion I finally convinced him to stay in the hospital. I have reviewed all of the hospital records as I did not provide his care after that day. Cardiac catheterization was done. He has a history of CABG in his grafts were patent. Despite a troponin of 20, no culprit was found. It is felt most likely that he must of occluded a small vessel. He also had some bradycardia. There was some AV block and this stabilized. Eventually he was discharged home. He was seen back in the office by electrophysiology and it was felt that his rhythm was stable. He is now here for general cardiology followup.  As part of today's evaluation I have reviewed the hospital discharge summary. I have reviewed the hospital labs. I have reviewed the electrophysiology office note after the hospital.  The patient explained to me at length what he is doing to keep up his activity. He's not having shortness of breath or chest pain.  No Known Allergies  Current Outpatient Prescriptions  Medication Sig Dispense Refill  . acetaminophen (TYLENOL) 500 MG tablet Take 500 mg by mouth every 6 (six) hours as needed for mild pain.      Marland Kitchen aspirin 81 MG tablet Take 81 mg by mouth. Take Twice a week      . atorvastatin (LIPITOR) 80 MG tablet Take 1 tablet (80 mg total) by mouth daily at 6 PM.  30 tablet  6  . clopidogrel (PLAVIX) 75 MG tablet Take 1 tablet (75 mg total) by mouth daily with breakfast.  30 tablet  6  . furosemide (LASIX) 20 MG tablet Take 1 tablet (20 mg total) by mouth daily.  30 tablet  6  . metFORMIN (GLUCOPHAGE) 500 MG tablet Take 1 tablet (500 mg total) by mouth 2 (two) times  daily with a meal.  60 tablet  3  . nitroGLYCERIN (NITROSTAT) 0.4 MG SL tablet Place 1 tablet (0.4 mg total) under the tongue every 5 (five) minutes x 3 doses as needed for chest pain.  25 tablet  3  . Omega-3 Fatty Acids (FISH OIL) 1200 MG CAPS Take 4 capsules by mouth daily.       . ramipril (ALTACE) 10 MG capsule Take 1 capsule (10 mg total) by mouth daily.  30 capsule  6  . spironolactone (ALDACTONE) 25 MG tablet Take 12.5 mg by mouth daily. Sometimes takes a whole tablet       No current facility-administered medications for this visit.    History   Social History  . Marital Status: Unknown    Spouse Name: N/A    Number of Children: N/A  . Years of Education: N/A   Occupational History  . Not on file.   Social History Main Topics  . Smoking status: Former Smoker    Quit date: 07/07/1961  . Smokeless tobacco: Not on file  . Alcohol Use: Yes     Comment: occasionally  . Drug Use: Not on file  . Sexual Activity: Not on file   Other Topics Concern  . Not on file   Social History Narrative  . No narrative on file  No family history on file.  Past Medical History  Diagnosis Date  . CAD (coronary artery disease) 10/2001    a. 10/2001 CABG x 4: LIMA to LAD, SVG to PDA, CX and Diagnonals;  b. 06/2013 NSTEMI/Cath: Lm 60ost, LAD 163m, LCX 165m, RCA 30 diff, VG->Diag nl, VG->PDA nl, VG->OM nl, LIMA->LAD nl.  . Diabetes mellitus without complication     a. Dx 2003  . Hypertension   . Hydrocele     a. Excision 12/2001  . Moderate mitral regurgitation   . Mobitz type 1 second degree atrioventricular block     a. noted on admission 06/2013 ->coreg d/c'd.  . Cardiomyopathy, ischemic     a. 06/2013 Echo: EF 40-45%, Gr2 DD, Mod MR, mild biatrial enlargement.  . Bradycardia   . Chronic systolic CHF (congestive heart failure)     a. 06/2013 EF 40-45% by echo.  . Ejection fraction < 50%     Past Surgical History  Procedure Laterality Date  . Cardiac surgery    . Coronary  artery bypass graft  10/2001  . Coronary stent placement  1999    LAD    Patient Active Problem List   Diagnosis Date Noted  . Ejection fraction < 50%   . Chronic systolic CHF (congestive heart failure)   . Cardiomyopathy, ischemic   . Mobitz type 1 second degree atrioventricular block   . Bradycardia 07/13/2013  . Moderate mitral regurgitation 07/13/2013  . CAD (coronary artery disease) 07/07/2013  . Hypertension 07/07/2013  . Hx of CABG 07/07/2013  . Diabetes mellitus without complication     ROS   Patient denies fever, chills, headache, sweats, rash, change in vision, change in hearing, chest pain, cough, nausea or vomiting, urinary symptoms. All other systems are reviewed and are negative.  PHYSICAL EXAM  Patient is oriented to person time and place. Affect is normal. He is a pleasant talkative gentleman. There is no jugulovenous distention. Lungs are clear. Respiratory effort is not labored. Cardiac exam reveals S1 and S2. There no clicks or significant murmurs. Abdomen is soft. There is no peripheral edema. There no musculoskeletal deformities. There are no skin rashes.  Filed Vitals:   08/19/13 1004  BP: 186/111  Pulse: 97  Height: $Remove'5\' 9"'CLsFkBa$  (1.753 m)  Weight: 204 lb (92.534 kg)     ASSESSMENT & PLAN

## 2013-08-19 NOTE — Assessment & Plan Note (Signed)
I am concerned about his blood pressure in the office today. He and cysts that his pressure is normal at home. I have urged him to recheck it again on a regular basis with a blood pressure cuff and not a finger monitor. He insists that his pressures in the 120 systolic range. If he notes any increase pressure at home, he will recontact me.

## 2013-08-19 NOTE — Assessment & Plan Note (Signed)
He does have mild to moderate mitral regurgitation by echo, 2014. This will be followed over time.

## 2013-08-19 NOTE — Assessment & Plan Note (Signed)
He had some mild AV block in the hospital. He was seen post hospital bile-like physiology. He was felt to be stable with this and no further workup is needed.

## 2013-08-19 NOTE — Assessment & Plan Note (Signed)
Coronary disease is stable after his recent hospitalization. He is on aspirin and a statin. No further workup is needed at this time.

## 2014-01-26 ENCOUNTER — Encounter: Payer: Self-pay | Admitting: Cardiology

## 2014-02-15 ENCOUNTER — Ambulatory Visit: Payer: Medicare Other | Admitting: Cardiology

## 2014-06-22 ENCOUNTER — Encounter (HOSPITAL_COMMUNITY): Payer: Self-pay | Admitting: Cardiovascular Disease

## 2016-02-28 ENCOUNTER — Emergency Department (HOSPITAL_COMMUNITY)
Admission: EM | Admit: 2016-02-28 | Discharge: 2016-02-28 | Disposition: A | Discharge status: Home / Self Care | Payer: Medicare Other | Attending: Emergency Medicine | Admitting: Emergency Medicine

## 2016-02-28 ENCOUNTER — Encounter (HOSPITAL_COMMUNITY): Payer: Self-pay

## 2016-02-28 ENCOUNTER — Emergency Department (HOSPITAL_COMMUNITY): Payer: Medicare Other

## 2016-02-28 DIAGNOSIS — R109 Unspecified abdominal pain: Secondary | ICD-10-CM | POA: Diagnosis not present

## 2016-02-28 DIAGNOSIS — Y990 Civilian activity done for income or pay: Secondary | ICD-10-CM | POA: Diagnosis not present

## 2016-02-28 DIAGNOSIS — Z951 Presence of aortocoronary bypass graft: Secondary | ICD-10-CM | POA: Insufficient documentation

## 2016-02-28 DIAGNOSIS — I5022 Chronic systolic (congestive) heart failure: Secondary | ICD-10-CM | POA: Diagnosis not present

## 2016-02-28 DIAGNOSIS — R42 Dizziness and giddiness: Secondary | ICD-10-CM | POA: Insufficient documentation

## 2016-02-28 DIAGNOSIS — Z7902 Long term (current) use of antithrombotics/antiplatelets: Secondary | ICD-10-CM | POA: Diagnosis not present

## 2016-02-28 DIAGNOSIS — Z87891 Personal history of nicotine dependence: Secondary | ICD-10-CM | POA: Insufficient documentation

## 2016-02-28 DIAGNOSIS — I2510 Atherosclerotic heart disease of native coronary artery without angina pectoris: Secondary | ICD-10-CM | POA: Insufficient documentation

## 2016-02-28 DIAGNOSIS — W1789XA Other fall from one level to another, initial encounter: Secondary | ICD-10-CM | POA: Diagnosis not present

## 2016-02-28 DIAGNOSIS — S32028A Other fracture of second lumbar vertebra, initial encounter for closed fracture: Secondary | ICD-10-CM | POA: Insufficient documentation

## 2016-02-28 DIAGNOSIS — S2231XA Fracture of one rib, right side, initial encounter for closed fracture: Secondary | ICD-10-CM | POA: Insufficient documentation

## 2016-02-28 DIAGNOSIS — Z955 Presence of coronary angioplasty implant and graft: Secondary | ICD-10-CM | POA: Diagnosis not present

## 2016-02-28 DIAGNOSIS — I110 Hypertensive heart disease with heart failure: Secondary | ICD-10-CM | POA: Diagnosis not present

## 2016-02-28 DIAGNOSIS — S32018A Other fracture of first lumbar vertebra, initial encounter for closed fracture: Secondary | ICD-10-CM | POA: Insufficient documentation

## 2016-02-28 DIAGNOSIS — Z7984 Long term (current) use of oral hypoglycemic drugs: Secondary | ICD-10-CM | POA: Insufficient documentation

## 2016-02-28 DIAGNOSIS — S32009A Unspecified fracture of unspecified lumbar vertebra, initial encounter for closed fracture: Secondary | ICD-10-CM

## 2016-02-28 DIAGNOSIS — E119 Type 2 diabetes mellitus without complications: Secondary | ICD-10-CM | POA: Diagnosis not present

## 2016-02-28 DIAGNOSIS — Y9389 Activity, other specified: Secondary | ICD-10-CM | POA: Diagnosis not present

## 2016-02-28 DIAGNOSIS — Y92009 Unspecified place in unspecified non-institutional (private) residence as the place of occurrence of the external cause: Secondary | ICD-10-CM | POA: Insufficient documentation

## 2016-02-28 DIAGNOSIS — Z7982 Long term (current) use of aspirin: Secondary | ICD-10-CM | POA: Insufficient documentation

## 2016-02-28 DIAGNOSIS — W19XXXA Unspecified fall, initial encounter: Secondary | ICD-10-CM

## 2016-02-28 DIAGNOSIS — S299XXA Unspecified injury of thorax, initial encounter: Secondary | ICD-10-CM | POA: Diagnosis present

## 2016-02-28 LAB — BASIC METABOLIC PANEL
Anion gap: 5 (ref 5–15)
BUN: 15 mg/dL (ref 6–20)
CO2: 26 mmol/L (ref 22–32)
Calcium: 8.7 mg/dL — ABNORMAL LOW (ref 8.9–10.3)
Chloride: 109 mmol/L (ref 101–111)
Creatinine, Ser: 0.99 mg/dL (ref 0.61–1.24)
GFR calc Af Amer: >60 mL/min (ref >60)
GLUCOSE: 210 mg/dL — AB (ref 65–99)
POTASSIUM: 4.5 mmol/L (ref 3.5–5.1)
Sodium: 140 mmol/L (ref 135–145)

## 2016-02-28 LAB — CBC WITH DIFFERENTIAL/PLATELET
Basophils Absolute: 0 10*3/uL (ref 0.0–0.1)
Basophils Relative: 0 %
EOS PCT: 2 %
Eosinophils Absolute: 0.1 10*3/uL (ref 0.0–0.7)
HEMATOCRIT: 44 % (ref 39.0–52.0)
Hemoglobin: 13.6 g/dL (ref 13.0–17.0)
LYMPHS ABS: 1 10*3/uL (ref 0.7–4.0)
Lymphocytes Relative: 15 %
MCH: 24.8 pg — AB (ref 26.0–34.0)
MCHC: 30.9 g/dL (ref 30.0–36.0)
MCV: 80.3 fL (ref 78.0–100.0)
MONO ABS: 0.5 10*3/uL (ref 0.1–1.0)
Monocytes Relative: 8 %
Neutro Abs: 5.2 10*3/uL (ref 1.7–7.7)
Neutrophils Relative %: 75 %
PLATELETS: 193 10*3/uL (ref 150–400)
RBC: 5.48 MIL/uL (ref 4.22–5.81)
RDW: 14.4 % (ref 11.5–15.5)
WBC: 6.8 10*3/uL (ref 4.0–10.5)

## 2016-02-28 LAB — I-STAT TROPONIN, ED: Troponin i, poc: 0.04 ng/mL (ref 0.00–0.08)

## 2016-02-28 MED ORDER — SENNOSIDES-DOCUSATE SODIUM 8.6-50 MG PO TABS: 1.0000 | ORAL_TABLET | Freq: Every day | ORAL | 0 refills | Status: DC

## 2016-02-28 MED ORDER — HYDROCODONE-ACETAMINOPHEN 5-325 MG PO TABS: 2.0000 | ORAL_TABLET | ORAL | 0 refills | Status: DC | PRN

## 2016-02-28 MED ORDER — SODIUM CHLORIDE 0.9 % IV BOLUS (SEPSIS)
500.0000 mL | Freq: Once | INTRAVENOUS | Status: AC
  Administered 2016-02-28: 500 mL via INTRAVENOUS

## 2016-02-28 MED ORDER — IOPAMIDOL (ISOVUE-300) INJECTION 61%
INTRAVENOUS | Status: AC
  Administered 2016-02-28: 100 mL
  Filled 2016-02-28: qty 100

## 2016-02-28 NOTE — ED Triage Notes (Signed)
Pt;. 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 Side.  Pt. Denies  Hitting his head.   He does have rt. Rib.flank pain , sore on palpation.  No crepitus noted.  Pt. Is alert and oriented X4.  Skin is pink , warm and dry.  Pt. Went home after he fell and soaked in a bathtub and had a difficult time getting out of the bathtub and felt her pulled a muscle on his rt. side

## 2016-02-28 NOTE — ED Provider Notes (Signed)
Emergency Department Provider Note   I have reviewed the triage vital signs and the nursing notes.   HISTORY  Chief Complaint Fall   HPI Billy Hood is a 82 y.o. male with PMH of CAD, HTN, and DM (not currently taking any medication for the last 2 years) presents to the emergency department for evaluation of questionable syncope and fall off of a ladder. The patient reports being approximately 3 feet off the ground cleaning a bathroom. He was using a combination of chemicals that he cannot recall the name of. He felt suddenly lightheaded and then had an apparent syncopal event. He was working with another person but they're in the other room. They heard a crash came into the room. No report of seizure activity. The patient denies any chest discomfort or heart palpitations prior to falling. No prior history of syncope. The patient denies any obvious head trauma. He was able to go home, get in the bath, and rest. In getting out of the bath he felt a sharp pain on his right side radiating down to his buttock. He denies any midline spine pain. He does have some persistent right lower chest wall/right upper quadrant abdominal pain.    Past Medical History:  Diagnosis Date  . Bradycardia   . CAD (coronary artery disease) 10/2001   a. 10/2001 CABG x 4: LIMA to LAD, SVG to PDA, CX and Diagnonals;  b. 06/2013 NSTEMI/Cath: Lm 60ost, LAD 164m, LCX 192m, RCA 30 diff, VG->Diag nl, VG->PDA nl, VG->OM nl, LIMA->LAD nl.  . Cardiomyopathy, ischemic    a. 06/2013 Echo: EF 40-45%, Gr2 DD, Mod MR, mild biatrial enlargement.  . Chronic systolic CHF (congestive heart failure) (HCC)    a. 06/2013 EF 40-45% by echo.  . Diabetes mellitus without complication (HCC)    a. Dx 2003  . Ejection fraction < 50%   . Hydrocele    a. Excision 12/2001  . Hypertension   . Mobitz type 1 second degree atrioventricular block    a. noted on admission 06/2013 ->coreg d/c'd.  . Moderate mitral regurgitation     Patient  Active Problem List   Diagnosis Date Noted  . Ejection fraction < 50%   . Chronic systolic CHF (congestive heart failure) (HCC)   . Cardiomyopathy, ischemic   . Mobitz type 1 second degree atrioventricular block   . Bradycardia 07/13/2013  . Moderate mitral regurgitation 07/13/2013  . CAD (coronary artery disease) 07/07/2013  . Hypertension 07/07/2013  . Hx of CABG 07/07/2013  . Diabetes mellitus without complication [facility])     Past Surgical History:  Procedure Laterality Date  . CARDIAC SURGERY    . CORONARY ARTERY BYPASS GRAFT  10/2001  . CORONARY STENT PLACEMENT  1999   LAD  . LEFT HEART CATHETERIZATION WITH CORONARY ANGIOGRAM N/A 07/08/2013   Procedure: LEFT HEART CATHETERIZATION WITH CORONARY ANGIOGRAM;  Surgeon: Kathleene Hazel, MD;  Location: [facility] CATH LAB;  Service: Cardiovascular;  Laterality: N/A;    Current Outpatient Rx  . Order #: [id number] Class: Historical Med  . Order #: [id number] Class: Historical Med  . Order #: [id number] Class: Historical Med  . Order #: [id number] Class: Historical Med  . Order #: [id number] Class: Historical Med  . Order #: [id number] Class: Historical Med  . Order #: [id number] Class: Historical Med  . Order #: [id number] Class: Print  . Order #: [id number] Class: Print  . Order #: [id number] Class: Print  . Order #: [id number] Class: Print  . Order #: [id number] Class: Print  . Order #: [id number] Class: Print  .  Order #: [id number] Class: Print    Allergies Lipitor [atorvastatin]  History reviewed. No pertinent family history.  Social History Social History  Substance Use Topics  . Smoking status: Former Smoker    Quit date: 07/07/1961  . Smokeless tobacco: Never Used  . Alcohol use Yes     Comment: occasionally    Review of Systems  Constitutional: No fever/chills Eyes: No visual changes. ENT: No sore throat. Cardiovascular: Denies chest pain. Positive right lateral chest wall pain to palpation.  Respiratory: Denies shortness of  breath. Gastrointestinal: Right sided abdominal pain.  No nausea, no vomiting.  No diarrhea.  No constipation. Genitourinary: Negative for dysuria. Musculoskeletal: Positive for right lower back pain. Skin: Negative for rash. Neurological: Negative for headaches, focal weakness or numbness.  10-point ROS otherwise negative.  ____________________________________________   PHYSICAL EXAM:  VITAL SIGNS: ED Triage Vitals  Enc Vitals Group     BP 02/28/16 1705 (!) 215/115     Pulse Rate 02/28/16 1705 92     Resp 02/28/16 1710 18     Temp 02/28/16 1709 98.4 F (36.9 C)     Temp Source 02/28/16 1709 Oral     SpO2 02/28/16 1705 98 %     Weight 02/28/16 1708 212 lb (96.2 kg)     Height 02/28/16 1708 5\' 9"  (1.753 m)   Constitutional: Alert and oriented. Well appearing and in no acute distress. Eyes: Conjunctivae are normal. PERRL.  Head: Atraumatic. Nose: No congestion/rhinnorhea. Mouth/Throat: Mucous membranes are moist.  Oropharynx non-erythematous. Neck: No stridor. No cervical spine tenderness to palpation. Cardiovascular: Normal rate, regular rhythm. Good peripheral circulation. Grossly normal heart sounds.   Respiratory: Normal respiratory effort.  No retractions. Lungs CTAB. Gastrointestinal: Soft with moderate right side tenderness. No rebound, guarding, or ecchymosis. No distention.  Musculoskeletal: No lower extremity tenderness nor edema. No gross deformities of extremities. Right lower lateral chest wall tenderness to palpation.  Neurologic:  Normal speech and language. No gross focal neurologic deficits are appreciated.  Skin:  Skin is warm, dry and intact. No rash noted. Psychiatric: Mood and affect are normal. Speech and behavior are normal.  ____________________________________________   LABS (all labs ordered are listed, but only abnormal results are displayed)  Labs Reviewed  BASIC METABOLIC PANEL - Abnormal; Notable for the following:       Result Value    Glucose, Bld 210 (*)    Calcium 8.7 (*)    All other components within normal limits  CBC WITH DIFFERENTIAL/PLATELET - Abnormal; Notable for the following:    MCH 24.8 (*)    All other components within normal limits  I-STAT TROPOININ, ED   ____________________________________________  EKG  Reviewed in MUSE. No STEMI.  ____________________________________________  RADIOLOGY  Dg Chest 2 View  Result Date: 02/28/2016 CLINICAL DATA:  Fall on right side.  Chest pain. EXAM: CHEST  2 VIEW COMPARISON:  Two-view chest x-ray 07/07/2013 FINDINGS: The heart size is exaggerate by low lung volumes. Mild prominence of the pulmonary hila is again noted. No focal airspace disease present. There is no edema or effusion to suggest failure. Degenerate changes are present throughout the thoracic spine. The visualized soft tissues and bony thorax are unremarkable. IMPRESSION: 1. No acute cardiopulmonary disease. 2. Low lung volumes. Electronically Signed   By: Marin Robertshristopher  Mattern M.D.   On: 02/28/2016 18:28   Ct Head Wo Contrast  Result Date: 02/28/2016 CLINICAL DATA:  Initial evaluation for acute trauma, fall. EXAM: CT HEAD WITHOUT CONTRAST CT CERVICAL SPINE WITHOUT  CONTRAST TECHNIQUE: Multidetector CT imaging of the head and cervical spine was performed following the standard protocol without intravenous contrast. Multiplanar CT image reconstructions of the cervical spine were also generated. COMPARISON:  None. FINDINGS: CT HEAD FINDINGS Scalp soft tissues demonstrate no acute abnormality. No acute abnormality about the globes and orbits. Paranasal sinuses are clear. Trace opacity within the inferior right mastoid air cells. Left mastoid air cells clear. Calvarium intact. Diffuse prominence of the CSF containing spaces is compatible with generalized cerebral atrophy, mildly advanced for age. Moderate confluent hypodensity within the periventricular and deep white matter both cerebral hemispheres most compatible  with chronic microvascular ischemic disease. Encephalomalacia within the right frontal, left occipital, and left frontal lobes compatible with remote ischemic infarcts. Small remote lacunar infarct within the right thalamus. Prominent vascular calcifications noted within the carotid siphons and distal vertebral arteries. No evidence for acute intracranial hemorrhage. No evidence for acute large vessel territory infarct. No mass lesion, midline shift or mass effect. No hydrocephalus. No extra-axial fluid collection. CT CERVICAL SPINE FINDINGS Straightening of the normal cervical lordosis. Trace anterolisthesis of C3 on C4. Vertebral body heights are preserved. Normal C1-2 articulations are intact. No prevertebral soft tissue swelling. No acute fracture or listhesis. Moderate multilevel degenerative spondylolysis as evidenced by intervertebral disc space narrowing, endplate sclerosis, and osteophytosis, present from C4-5 through C7-T1. Multilevel facet arthropathy present. Visualized soft tissues of the neck demonstrate no acute abnormality. Prominent vascular calcifications about the carotid bifurcations. Visualized lung apices are clear without evidence of apical pneumothorax. IMPRESSION: CT BRAIN: 1. No acute intracranial process. 2. Multiple remote cortical infarcts involving the right frontal, left occipital, and left frontal lobes. 3. Moderate cerebral atrophy with chronic microvascular ischemic disease. CT CERVICAL SPINE: 1. No acute traumatic injury within the cervical spine. 2. Moderate multilevel degenerative spondylolysis extending from C4-5 through C7-T1. Electronically Signed   By: Rise Mu M.D.   On: 02/28/2016 21:21   Ct Cervical Spine Wo Contrast  Result Date: 02/28/2016 CLINICAL DATA:  Initial evaluation for acute trauma, fall. EXAM: CT HEAD WITHOUT CONTRAST CT CERVICAL SPINE WITHOUT CONTRAST TECHNIQUE: Multidetector CT imaging of the head and cervical spine was performed following the  standard protocol without intravenous contrast. Multiplanar CT image reconstructions of the cervical spine were also generated. COMPARISON:  None. FINDINGS: CT HEAD FINDINGS Scalp soft tissues demonstrate no acute abnormality. No acute abnormality about the globes and orbits. Paranasal sinuses are clear. Trace opacity within the inferior right mastoid air cells. Left mastoid air cells clear. Calvarium intact. Diffuse prominence of the CSF containing spaces is compatible with generalized cerebral atrophy, mildly advanced for age. Moderate confluent hypodensity within the periventricular and deep white matter both cerebral hemispheres most compatible with chronic microvascular ischemic disease. Encephalomalacia within the right frontal, left occipital, and left frontal lobes compatible with remote ischemic infarcts. Small remote lacunar infarct within the right thalamus. Prominent vascular calcifications noted within the carotid siphons and distal vertebral arteries. No evidence for acute intracranial hemorrhage. No evidence for acute large vessel territory infarct. No mass lesion, midline shift or mass effect. No hydrocephalus. No extra-axial fluid collection. CT CERVICAL SPINE FINDINGS Straightening of the normal cervical lordosis. Trace anterolisthesis of C3 on C4. Vertebral body heights are preserved. Normal C1-2 articulations are intact. No prevertebral soft tissue swelling. No acute fracture or listhesis. Moderate multilevel degenerative spondylolysis as evidenced by intervertebral disc space narrowing, endplate sclerosis, and osteophytosis, present from C4-5 through C7-T1. Multilevel facet arthropathy present. Visualized soft tissues of the neck  demonstrate no acute abnormality. Prominent vascular calcifications about the carotid bifurcations. Visualized lung apices are clear without evidence of apical pneumothorax. IMPRESSION: CT BRAIN: 1. No acute intracranial process. 2. Multiple remote cortical infarcts  involving the right frontal, left occipital, and left frontal lobes. 3. Moderate cerebral atrophy with chronic microvascular ischemic disease. CT CERVICAL SPINE: 1. No acute traumatic injury within the cervical spine. 2. Moderate multilevel degenerative spondylolysis extending from C4-5 through C7-T1. Electronically Signed   By: Rise Mu M.D.   On: 02/28/2016 21:21   Ct Abdomen Pelvis W Contrast  Result Date: 02/28/2016 CLINICAL DATA:  Status post fall off ladder onto right side, with right rib and flank pain. Initial encounter. EXAM: CT ABDOMEN AND PELVIS WITH CONTRAST TECHNIQUE: Multidetector CT imaging of the abdomen and pelvis was performed using the standard protocol following bolus administration of intravenous contrast. CONTRAST:  ISOVUE-300 IOPAMIDOL (ISOVUE-300) INJECTION 61% COMPARISON:  None. FINDINGS: The visualized lung bases are clear. A small hiatal hernia is noted. Scattered coronary artery calcifications are seen. The patient is status post median sternotomy. The liver and spleen are unremarkable in appearance. The gallbladder is within normal limits. The pancreas and adrenal glands are unremarkable. The kidneys are unremarkable in appearance. There is no evidence of hydronephrosis. No renal or ureteral stones are seen. Nonspecific perinephric stranding is noted bilaterally. No free fluid is identified. The small bowel is unremarkable in appearance. The stomach is within normal limits. No acute vascular abnormalities are seen. Scattered calcification is seen along the abdominal aorta and its branches. The appendix is not definitely characterized; there is no evidence of appendicitis. Minimal diverticulosis is noted at the proximal sigmoid colon, without evidence of diverticulitis. The bladder is moderately distended. There is nodular extension of the prostate into the bladder. The prostate is enlarged, measuring 5.9 cm in transverse dimension. No inguinal lymphadenopathy is  seen. Mild soft tissue inflammation about the right inguinal region may reflect a prior procedure. There appears to be a minimally displaced fracture of the right posterior tenth rib, and mildly displaced fractures of the right transverse processes of L1 and L2. Multilevel vacuum phenomenon is noted along the lumbar spine. IMPRESSION: 1. Minimally displaced fracture of the right posterior tenth rib, and mildly displaced fractures of the right transverse processes of L1 and L2. 2. No additional evidence for traumatic injury to the abdomen or pelvis. 3. Enlarged prostate, measuring 5.9 cm in transverse dimension. Nodular extension of the prostate into the bladder. Malignancy cannot be entirely excluded. Would correlate with PSA and symptoms. 4. Scattered calcification along the abdominal aorta and its branches. 5. Scattered coronary artery calcifications seen. 6. Small hiatal hernia noted. 7. Minimal diverticulosis at the proximal sigmoid colon, without evidence of diverticulitis. 8. Mild soft tissue inflammation about the right inguinal region may reflect a prior procedure. These results were called by telephone at the time of interpretation on 02/28/2016 at 9:03 pm to Dr. Alona Bene, who verbally acknowledged these results. Electronically Signed   By: Roanna Raider M.D.   On: 02/28/2016 21:07    ____________________________________________   PROCEDURES  Procedure(s) performed:   Procedures  None ____________________________________________   INITIAL IMPRESSION / ASSESSMENT AND PLAN / ED COURSE  Pertinent labs & imaging results that were available during my care of the patient were reviewed by me and considered in my medical decision making (see chart for details).  Patient presents to the emergency department for evaluation of syncopal event while up on a ladder. He had a  fall to the ground with resulting right lateral chest wall pain and right upper quadrant abdominal pain. Moderate tenderness  to palpation with no rebound or guarding. No ecchymosis. Has full range of motion of both hips. No apparent extremity injury. No concerning features prior to syncope. The patient was working in an enclosed, hot area with significant fumes from cleaning. Given the patient's age and fall from a height with above exam findings I plan for relatively aggressive imaging with CT scan of the head and neck. We will obtain chest x-ray to evaluate for right sided rib fractures and CT scan of the abdomen and pelvis given his tenderness on exam. We'll also obtain syncope labs. EKG with nonspecific ST changes. Does have isolated PVC. No clear underlying arrhythmia.  11:21 PM Updated patient regarding imaging, rib fracture, transverse process fracture. Discussed with neurosurgery, Dr. Cram regarding the transverse process fractures. No need for neurosurgery Wynetta Emeryfollow up. Will discharge at this time.   Also discussed the patient's need for PCP follow up for multiple issues including DM, HTN, and enlarged prostate. Gave contact information for PCP in discharge paperwork. Incentive spirometry given along with instruction for rib fracture.   At this time, I do not feel there is any life-threatening condition present. I have reviewed and discussed all results (EKG, imaging, lab, urine as appropriate), exam findings with patient. I have reviewed nursing notes and appropriate previous records.  I feel the patient is safe to be discharged home without further emergent workup. Discussed usual and customary return precautions. Patient and family (if present) verbalize understanding and are comfortable with this plan.  Patient will follow-up with their primary care provider. If they do not have a primary care provider, information for follow-up has been provided to them. All questions have been answered.   ____________________________________________  FINAL CLINICAL IMPRESSION(S) / ED DIAGNOSES  Final diagnoses:  Fall, initial  encounter  Rib fracture, right, closed, initial encounter  Lumbar transverse process fracture, closed, initial encounter [facility](HCC)     MEDICATIONS GIVEN DURING THIS VISIT:  Medications  sodium chloride 0.9 % bolus 500 mL (0 mLs Intravenous Stopped 02/28/16 2156)  iopamidol (ISOVUE-300) 61 % injection (100 mLs  Contrast Given 02/28/16 1951)     NEW OUTPATIENT MEDICATIONS STARTED DURING THIS VISIT:  Discharge Medication List as of 02/28/2016 11:20 PM    START taking these medications   Details  HYDROcodone-acetaminophen (NORCO/VICODIN) 5-325 MG tablet Take 2 tablets by mouth every 4 (four) hours as needed., Starting Thu 02/28/2016, Print    senna-docusate (SENOKOT-S) 8.6-50 MG tablet Take 1 tablet by mouth daily., Starting Thu 02/28/2016, Print          Note:  This document was prepared using Dragon voice recognition software and may include unintentional dictation errors.  Alona BeneJoshua Long, MD Emergency Medicine   Maia PlanJoshua G Long, MD 02/29/16 [phone]

## 2016-02-28 NOTE — Discharge Instructions (Signed)
You have been seen in the Emergency Department (ED) today for a fall.  Your work up does not show any concerning injuries.  Please take over-the-counter Tylenol as needed for your pain (unless you have an allergy or your doctor as told you not to take them), or take any prescribed medication as instructed.  Please follow up with your doctor regarding today's Emergency Department (ED) visit and your recent fall.    Also, your blood pressure was elevated and the CT scan showed a large prostate. Discuss this with your PCP to decide of further testing. Your blood pressure was also high and should probably be re-checked by your PCP to decide if you need medication.   Return to the ED if you have any headache, confusion, slurred speech, weakness/numbness of any arm or leg, or any increased pain.

## 2016-02-28 NOTE — ED Notes (Signed)
Pt oriented about use of IS for breathing pt verbalized and demonstrate understanding, family member at the bedside, NAD noticed.

## 2016-10-29 ENCOUNTER — Inpatient Hospital Stay (HOSPITAL_COMMUNITY)
Admission: EM | Admit: 2016-10-29 | Discharge: 2016-10-31 | DRG: 041 | Disposition: A | Discharge status: Home / Self Care | Payer: Medicare Other | Attending: Internal Medicine | Admitting: Internal Medicine

## 2016-10-29 ENCOUNTER — Emergency Department (HOSPITAL_COMMUNITY): Payer: Medicare Other

## 2016-10-29 ENCOUNTER — Encounter (HOSPITAL_COMMUNITY): Payer: Self-pay | Admitting: Emergency Medicine

## 2016-10-29 DIAGNOSIS — I110 Hypertensive heart disease with heart failure: Secondary | ICD-10-CM | POA: Diagnosis present

## 2016-10-29 DIAGNOSIS — R410 Disorientation, unspecified: Secondary | ICD-10-CM

## 2016-10-29 DIAGNOSIS — I340 Nonrheumatic mitral (valve) insufficiency: Secondary | ICD-10-CM | POA: Diagnosis present

## 2016-10-29 DIAGNOSIS — Z7982 Long term (current) use of aspirin: Secondary | ICD-10-CM

## 2016-10-29 DIAGNOSIS — I10 Essential (primary) hypertension: Secondary | ICD-10-CM | POA: Diagnosis not present

## 2016-10-29 DIAGNOSIS — H53462 Homonymous bilateral field defects, left side: Secondary | ICD-10-CM | POA: Diagnosis present

## 2016-10-29 DIAGNOSIS — I252 Old myocardial infarction: Secondary | ICD-10-CM | POA: Diagnosis not present

## 2016-10-29 DIAGNOSIS — Z888 Allergy status to other drugs, medicaments and biological substances status: Secondary | ICD-10-CM

## 2016-10-29 DIAGNOSIS — Z8249 Family history of ischemic heart disease and other diseases of the circulatory system: Secondary | ICD-10-CM

## 2016-10-29 DIAGNOSIS — E785 Hyperlipidemia, unspecified: Secondary | ICD-10-CM | POA: Diagnosis present

## 2016-10-29 DIAGNOSIS — I5022 Chronic systolic (congestive) heart failure: Secondary | ICD-10-CM | POA: Diagnosis not present

## 2016-10-29 DIAGNOSIS — Z955 Presence of coronary angioplasty implant and graft: Secondary | ICD-10-CM | POA: Diagnosis not present

## 2016-10-29 DIAGNOSIS — Z87891 Personal history of nicotine dependence: Secondary | ICD-10-CM

## 2016-10-29 DIAGNOSIS — E663 Overweight: Secondary | ICD-10-CM | POA: Diagnosis present

## 2016-10-29 DIAGNOSIS — I6389 Other cerebral infarction: Secondary | ICD-10-CM

## 2016-10-29 DIAGNOSIS — Z6829 Body mass index (BMI) 29.0-29.9, adult: Secondary | ICD-10-CM

## 2016-10-29 DIAGNOSIS — I639 Cerebral infarction, unspecified: Secondary | ICD-10-CM | POA: Diagnosis not present

## 2016-10-29 DIAGNOSIS — I2510 Atherosclerotic heart disease of native coronary artery without angina pectoris: Secondary | ICD-10-CM | POA: Diagnosis present

## 2016-10-29 DIAGNOSIS — I255 Ischemic cardiomyopathy: Secondary | ICD-10-CM | POA: Diagnosis present

## 2016-10-29 DIAGNOSIS — Z9114 Patient's other noncompliance with medication regimen: Secondary | ICD-10-CM

## 2016-10-29 DIAGNOSIS — I63531 Cerebral infarction due to unspecified occlusion or stenosis of right posterior cerebral artery: Secondary | ICD-10-CM | POA: Diagnosis not present

## 2016-10-29 DIAGNOSIS — E876 Hypokalemia: Secondary | ICD-10-CM | POA: Diagnosis present

## 2016-10-29 DIAGNOSIS — Z951 Presence of aortocoronary bypass graft: Secondary | ICD-10-CM | POA: Diagnosis not present

## 2016-10-29 DIAGNOSIS — E119 Type 2 diabetes mellitus without complications: Secondary | ICD-10-CM

## 2016-10-29 DIAGNOSIS — Z7984 Long term (current) use of oral hypoglycemic drugs: Secondary | ICD-10-CM

## 2016-10-29 DIAGNOSIS — R001 Bradycardia, unspecified: Secondary | ICD-10-CM | POA: Diagnosis present

## 2016-10-29 DIAGNOSIS — I63431 Cerebral infarction due to embolism of right posterior cerebral artery: Principal | ICD-10-CM | POA: Diagnosis present

## 2016-10-29 DIAGNOSIS — Z79899 Other long term (current) drug therapy: Secondary | ICD-10-CM

## 2016-10-29 DIAGNOSIS — I638 Other cerebral infarction: Secondary | ICD-10-CM | POA: Diagnosis not present

## 2016-10-29 DIAGNOSIS — I63331 Cerebral infarction due to thrombosis of right posterior cerebral artery: Secondary | ICD-10-CM | POA: Diagnosis not present

## 2016-10-29 DIAGNOSIS — I6789 Other cerebrovascular disease: Secondary | ICD-10-CM | POA: Diagnosis not present

## 2016-10-29 DIAGNOSIS — R9430 Abnormal result of cardiovascular function study, unspecified: Secondary | ICD-10-CM | POA: Diagnosis present

## 2016-10-29 LAB — COMPREHENSIVE METABOLIC PANEL
ALT: 11 U/L — AB (ref 17–63)
AST: 16 U/L (ref 15–41)
Albumin: 3.3 g/dL — ABNORMAL LOW (ref 3.5–5.0)
Alkaline Phosphatase: 70 U/L (ref 38–126)
Anion gap: 7 (ref 5–15)
BUN: 18 mg/dL (ref 6–20)
CHLORIDE: 106 mmol/L (ref 101–111)
CO2: 26 mmol/L (ref 22–32)
CREATININE: 1.04 mg/dL (ref 0.61–1.24)
Calcium: 8.7 mg/dL — ABNORMAL LOW (ref 8.9–10.3)
GFR calc Af Amer: >60 mL/min (ref >60)
GFR calc non Af Amer: >60 mL/min (ref >60)
Glucose, Bld: 222 mg/dL — ABNORMAL HIGH (ref 65–99)
POTASSIUM: 3.4 mmol/L — AB (ref 3.5–5.1)
SODIUM: 139 mmol/L (ref 135–145)
Total Bilirubin: 0.8 mg/dL (ref 0.3–1.2)
Total Protein: 5.6 g/dL — ABNORMAL LOW (ref 6.5–8.1)

## 2016-10-29 LAB — URINALYSIS, ROUTINE W REFLEX MICROSCOPIC
BACTERIA UA: NONE SEEN
BILIRUBIN URINE: NEGATIVE
Glucose, UA: 150 mg/dL — AB
Ketones, ur: NEGATIVE mg/dL
LEUKOCYTES UA: NEGATIVE
NITRITE: NEGATIVE
Protein, ur: NEGATIVE mg/dL
SPECIFIC GRAVITY, URINE: 1.023 (ref 1.005–1.030)
Squamous Epithelial / LPF: NONE SEEN
pH: 5 (ref 5.0–8.0)

## 2016-10-29 LAB — CBC WITH DIFFERENTIAL/PLATELET
BASOS PCT: 0 %
Basophils Absolute: 0 10*3/uL (ref 0.0–0.1)
EOS ABS: 0.1 10*3/uL (ref 0.0–0.7)
Eosinophils Relative: 2 %
HCT: 40.1 % (ref 39.0–52.0)
Hemoglobin: 12.3 g/dL — ABNORMAL LOW (ref 13.0–17.0)
LYMPHS ABS: 1.5 10*3/uL (ref 0.7–4.0)
Lymphocytes Relative: 31 %
MCH: 23.3 pg — ABNORMAL LOW (ref 26.0–34.0)
MCHC: 30.7 g/dL (ref 30.0–36.0)
MCV: 75.9 fL — ABNORMAL LOW (ref 78.0–100.0)
MONOS PCT: 8 %
Monocytes Absolute: 0.4 10*3/uL (ref 0.1–1.0)
NEUTROS ABS: 2.9 10*3/uL (ref 1.7–7.7)
NEUTROS PCT: 59 %
PLATELETS: 194 10*3/uL (ref 150–400)
RBC: 5.28 MIL/uL (ref 4.22–5.81)
RDW: 14.3 % (ref 11.5–15.5)
WBC: 4.9 10*3/uL (ref 4.0–10.5)

## 2016-10-29 LAB — CBG MONITORING, ED: GLUCOSE-CAPILLARY: 226 mg/dL — AB (ref 65–99)

## 2016-10-29 LAB — RAPID URINE DRUG SCREEN, HOSP PERFORMED
AMPHETAMINES: NOT DETECTED
Barbiturates: NOT DETECTED
Benzodiazepines: NOT DETECTED
Cocaine: NOT DETECTED
OPIATES: NOT DETECTED
TETRAHYDROCANNABINOL: NOT DETECTED

## 2016-10-29 LAB — TROPONIN I: Troponin I: <0.03 ng/mL (ref <0.03)

## 2016-10-29 LAB — ETHANOL: Alcohol, Ethyl (B): <5 mg/dL (ref <5)

## 2016-10-29 MED ORDER — STROKE: EARLY STAGES OF RECOVERY BOOK
Freq: Once | Status: AC
  Administered 2016-10-29: 23:00:00
  Filled 2016-10-29: qty 1

## 2016-10-29 MED ORDER — SENNOSIDES-DOCUSATE SODIUM 8.6-50 MG PO TABS
1.0000 | ORAL_TABLET | Freq: Every evening | ORAL | Status: DC | PRN
  Filled 2016-10-29: qty 1

## 2016-10-29 MED ORDER — ASPIRIN 81 MG PO CHEW
324.0000 mg | CHEWABLE_TABLET | Freq: Once | ORAL | Status: AC
Start: 2016-10-29 — End: 2016-10-29
  Administered 2016-10-29: 324 mg via ORAL
  Filled 2016-10-29: qty 4

## 2016-10-29 MED ORDER — POTASSIUM CHLORIDE CRYS ER 20 MEQ PO TBCR
40.0000 meq | EXTENDED_RELEASE_TABLET | Freq: Once | ORAL | Status: AC
  Administered 2016-10-29: 40 meq via ORAL
  Filled 2016-10-29: qty 2

## 2016-10-29 MED ORDER — ACETAMINOPHEN 160 MG/5ML PO SOLN: 650.0000 mg | ORAL | Status: DC | PRN

## 2016-10-29 MED ORDER — INSULIN ASPART 100 UNIT/ML ~~LOC~~ SOLN
0.0000 [IU] | Freq: Three times a day (TID) | SUBCUTANEOUS | Status: DC
  Administered 2016-10-30: 5 [IU] via SUBCUTANEOUS
  Administered 2016-10-30: 3 [IU] via SUBCUTANEOUS
  Administered 2016-10-31: 5 [IU] via SUBCUTANEOUS

## 2016-10-29 MED ORDER — ACETAMINOPHEN 325 MG PO TABS: 650.0000 mg | ORAL_TABLET | ORAL | Status: DC | PRN

## 2016-10-29 MED ORDER — ASPIRIN 325 MG PO TABS
325.0000 mg | ORAL_TABLET | Freq: Every day | ORAL | Status: DC
  Administered 2016-10-30 – 2016-10-31 (×2): 325 mg via ORAL
  Filled 2016-10-29 (×2): qty 1

## 2016-10-29 MED ORDER — ENOXAPARIN SODIUM 40 MG/0.4ML ~~LOC~~ SOLN
40.0000 mg | SUBCUTANEOUS | Status: DC
  Administered 2016-10-29 – 2016-10-30 (×2): 40 mg via SUBCUTANEOUS
  Filled 2016-10-29 (×3): qty 0.4

## 2016-10-29 MED ORDER — ATORVASTATIN CALCIUM 40 MG PO TABS
40.0000 mg | ORAL_TABLET | Freq: Every day | ORAL | Status: DC
  Administered 2016-10-29 – 2016-10-30 (×2): 40 mg via ORAL
  Filled 2016-10-29 (×2): qty 1

## 2016-10-29 MED ORDER — ACETAMINOPHEN 650 MG RE SUPP: 650.0000 mg | RECTAL | Status: DC | PRN

## 2016-10-29 MED ORDER — ASPIRIN 325 MG PO TABS: 325.0000 mg | ORAL_TABLET | Freq: Every day | ORAL | Status: DC
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
## 2016-10-29 NOTE — ED Notes (Signed)
Patient transported to CT 

## 2016-10-29 NOTE — ED Notes (Signed)
Phlebotomy at bedside.

## 2016-10-29 NOTE — ED Notes (Signed)
EDP at bedside  
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
## 2016-10-29 NOTE — ED Notes (Signed)
EDP made aware of delay in MRI due to high volumes.

## 2016-10-29 NOTE — ED Notes (Signed)
Patient transported to MRI 
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
## 2016-10-29 NOTE — ED Notes (Signed)
Patient attempting urine collection now  

## 2016-10-30 ENCOUNTER — Inpatient Hospital Stay (HOSPITAL_COMMUNITY): Payer: Medicare Other

## 2016-10-30 DIAGNOSIS — I63531 Cerebral infarction due to unspecified occlusion or stenosis of right posterior cerebral artery: Secondary | ICD-10-CM

## 2016-10-30 DIAGNOSIS — I63331 Cerebral infarction due to thrombosis of right posterior cerebral artery: Secondary | ICD-10-CM

## 2016-10-30 DIAGNOSIS — I2510 Atherosclerotic heart disease of native coronary artery without angina pectoris: Secondary | ICD-10-CM

## 2016-10-30 DIAGNOSIS — I6789 Other cerebrovascular disease: Secondary | ICD-10-CM

## 2016-10-30 LAB — LIPID PANEL
Cholesterol: 173 mg/dL (ref 0–200)
HDL: 33 mg/dL — ABNORMAL LOW (ref >40)
LDL Cholesterol: 122 mg/dL — ABNORMAL HIGH (ref 0–99)
Total CHOL/HDL Ratio: 5.2 ratio
Triglycerides: 89 mg/dL (ref <150)
VLDL: 18 mg/dL (ref 0–40)

## 2016-10-30 LAB — GLUCOSE, CAPILLARY
GLUCOSE-CAPILLARY: 231 mg/dL — AB (ref 65–99)
Glucose-Capillary: 119 mg/dL — ABNORMAL HIGH (ref 65–99)
Glucose-Capillary: 156 mg/dL — ABNORMAL HIGH (ref 65–99)
Glucose-Capillary: 92 mg/dL (ref 65–99)

## 2016-10-30 LAB — ECHOCARDIOGRAM COMPLETE
Height: 69 in
WEIGHTICAEL: 3246.4 [oz_av]

## 2016-10-30 LAB — VAS US CAROTID
LCCADDIAS: -11 cm/s
LCCAPDIAS: 12 cm/s
LCCAPSYS: 119 cm/s
LEFT ECA DIAS: -13 cm/s
LEFT VERTEBRAL DIAS: -8 cm/s
LICADDIAS: -15 cm/s
LICADSYS: -63 cm/s
LICAPSYS: -81 cm/s
Left CCA dist sys: -86 cm/s
Left ICA prox dias: -20 cm/s
RIGHT ECA DIAS: -17 cm/s
RIGHT VERTEBRAL DIAS: -2 cm/s
Right CCA prox dias: 10 cm/s
Right CCA prox sys: 103 cm/s
Right cca dist sys: -92 cm/s

## 2016-10-30 NOTE — Progress Notes (Signed)
  Echocardiogram 2D Echocardiogram has been performed.  Tye Savoy 10/30/2016, 2:35 PM

## 2016-10-30 NOTE — Progress Notes (Signed)
TEE-Informed Consent signed
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
## 2016-10-30 NOTE — Progress Notes (Signed)
[facility] notified that patient had 7 beat run wide QRS bp 162/88HR 67 no bp meds scheduled tonight. Patient asymptomatic. Will continue to monitor. Ilean Skill LPN

## 2016-10-30 NOTE — Progress Notes (Signed)
SLP Cancellation Note  Patient Details Name: Billy Hood MRN: [id number] DOB: 1932-02-03   Cancelled treatment:       Reason Eval/Treat Not Completed: Patient at procedure or test/unavailable. Per RN, pt leaving unit for testing at this time. ST will continue efforts.  [name] B. [name] [employer], CCC-SLP [phone]  Leigh Aurora 10/30/2016, 1:18 PM

## 2016-10-30 NOTE — Progress Notes (Addendum)
    [facility] has been requested to perform a transesophageal echocardiogram on Billy Hood for stroke.  After careful review of history and examination, the risks and benefits of transesophageal echocardiogram have been explained including risks of esophageal damage, perforation (1:10,000 risk), bleeding, pharyngeal hematoma as well as other potential complications associated with conscious sedation including aspiration, arrhythmia, respiratory failure and death. Alternatives to treatment were discussed, questions were answered. Patient is willing to proceed. I also spoke with the patient's son Asher Torpey, [place], via phone and he agrees to proceed with the procedure.   Patient is scheduled for 10/31/16 at 0800 with Dr. Gala Romney.   Berton Bon, NP  10/30/2016 2:41 PM
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
## 2016-10-30 NOTE — Progress Notes (Addendum)
VASCULAR LAB PRELIMINARY  PRELIMINARY  PRELIMINARY  PRELIMINARY  Carotid duplex completed.    Preliminary report:  1-39% ICA plaquing. Right vertebral artery flow exhibits the bunny sign. Left Vertebral artery flow is antegrade.  Platon Arocho, RVT 10/30/2016, 2:25 PM
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
## 2016-10-31 ENCOUNTER — Encounter (HOSPITAL_COMMUNITY): Payer: Self-pay | Admitting: *Deleted

## 2016-10-31 ENCOUNTER — Encounter (HOSPITAL_COMMUNITY)
Admission: EM | Disposition: A | Discharge status: Home / Self Care | Payer: Self-pay | Source: Home / Self Care | Attending: Internal Medicine

## 2016-10-31 ENCOUNTER — Inpatient Hospital Stay (HOSPITAL_COMMUNITY)
Admission: EM | Disposition: A | Discharge status: Home / Self Care | Payer: Self-pay | Source: Home / Self Care | Attending: Internal Medicine

## 2016-10-31 ENCOUNTER — Inpatient Hospital Stay (HOSPITAL_COMMUNITY): Payer: Medicare Other

## 2016-10-31 DIAGNOSIS — I5022 Chronic systolic (congestive) heart failure: Secondary | ICD-10-CM

## 2016-10-31 DIAGNOSIS — I639 Cerebral infarction, unspecified: Secondary | ICD-10-CM

## 2016-10-31 DIAGNOSIS — I638 Other cerebral infarction: Secondary | ICD-10-CM

## 2016-10-31 HISTORY — PX: TEE WITHOUT CARDIOVERSION: SHX5443

## 2016-10-31 HISTORY — PX: LOOP RECORDER INSERTION: EP1214

## 2016-10-31 LAB — HEMOGLOBIN A1C
HEMOGLOBIN A1C: 7.7 % — AB (ref 4.8–5.6)
MEAN PLASMA GLUCOSE: 174 mg/dL

## 2016-10-31 LAB — GLUCOSE, CAPILLARY
GLUCOSE-CAPILLARY: 100 mg/dL — AB (ref 65–99)
Glucose-Capillary: 203 mg/dL — ABNORMAL HIGH (ref 65–99)

## 2016-10-31 SURGERY — ECHOCARDIOGRAM, TRANSESOPHAGEAL
Anesthesia: Moderate Sedation

## 2016-10-31 SURGERY — LOOP RECORDER INSERTION

## 2016-10-31 MED ORDER — FUROSEMIDE 20 MG PO TABS: 20.0000 mg | ORAL_TABLET | Freq: Every day | ORAL | 1 refills | Status: AC

## 2016-10-31 MED ORDER — MIDAZOLAM HCL 5 MG/ML IJ SOLN
INTRAMUSCULAR | Status: AC
  Filled 2016-10-31: qty 2

## 2016-10-31 MED ORDER — ASPIRIN 325 MG PO TABS: 325.0000 mg | ORAL_TABLET | Freq: Every day | ORAL | 1 refills | Status: AC

## 2016-10-31 MED ORDER — FENTANYL CITRATE (PF) 100 MCG/2ML IJ SOLN
INTRAMUSCULAR | Status: AC
  Filled 2016-10-31: qty 2

## 2016-10-31 MED ORDER — LIDOCAINE HCL (PF) 1 % IJ SOLN
INTRAMUSCULAR | Status: AC
  Filled 2016-10-31: qty 30

## 2016-10-31 MED ORDER — FENTANYL CITRATE (PF) 100 MCG/2ML IJ SOLN
INTRAMUSCULAR | Status: DC | PRN
  Administered 2016-10-31 (×2): 25 ug via INTRAVENOUS

## 2016-10-31 MED ORDER — METFORMIN HCL 500 MG PO TABS: 500.0000 mg | ORAL_TABLET | Freq: Two times a day (BID) | ORAL | 1 refills | Status: AC

## 2016-10-31 MED ORDER — LIDOCAINE HCL (PF) 1 % IJ SOLN
INTRAMUSCULAR | Status: DC | PRN
  Administered 2016-10-31: 10 mL

## 2016-10-31 MED ORDER — MIDAZOLAM HCL 10 MG/2ML IJ SOLN
INTRAMUSCULAR | Status: DC | PRN
  Administered 2016-10-31: 2 mg via INTRAVENOUS

## 2016-10-31 MED ORDER — LIDOCAINE VISCOUS 2 % MT SOLN
OROMUCOSAL | Status: AC
  Filled 2016-10-31: qty 15

## 2016-10-31 MED ORDER — LISINOPRIL 10 MG PO TABS: 10.0000 mg | ORAL_TABLET | Freq: Every day | ORAL | 1 refills | Status: AC

## 2016-10-31 MED ORDER — ATORVASTATIN CALCIUM 40 MG PO TABS: 40.0000 mg | ORAL_TABLET | Freq: Every day | ORAL | 1 refills | Status: AC

## 2016-10-31 MED ORDER — LIDOCAINE-EPINEPHRINE 1 %-1:100000 IJ SOLN
INTRAMUSCULAR | Status: AC
  Filled 2016-10-31: qty 1

## 2016-10-31 MED ORDER — NITROGLYCERIN 0.4 MG SL SUBL: 0.4000 mg | SUBLINGUAL_TABLET | SUBLINGUAL | 0 refills | Status: AC | PRN

## 2016-10-31 MED ORDER — LIDOCAINE VISCOUS 2 % MT SOLN
OROMUCOSAL | Status: DC | PRN
  Administered 2016-10-31: 10 mL via OROMUCOSAL

## 2016-10-31 MED ORDER — SODIUM CHLORIDE 0.9 % IV SOLN: INTRAVENOUS | Status: DC

## 2016-10-31 SURGICAL SUPPLY — 2 items
LOOP REVEAL LINQSYS (Prosthesis & Implant Heart) ×1 IMPLANT
PACK LOOP INSERTION (CUSTOM PROCEDURE TRAY) ×2 IMPLANT
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
## 2016-10-31 NOTE — Progress Notes (Signed)
Pt discharged home with son. Discharge instructions were reviewed with the patient and son. Both son and patient verbalized understanding.
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
## 2016-10-31 NOTE — Discharge Instructions (Signed)
Do not drive 
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
## 2016-10-31 NOTE — Interval H&P Note (Signed)
History and Physical Interval Note:  10/31/2016 8:10 AM  Billy Hood  has presented today for surgery, with the diagnosis of STROKE  The various methods of treatment have been discussed with the patient and family. After consideration of risks, benefits and other options for treatment, the patient has consented to  Procedure(s): TRANSESOPHAGEAL ECHOCARDIOGRAM (TEE) (N/A) as a surgical intervention .  The patient's history has been reviewed, patient examined, no change in status, stable for surgery.  I have reviewed the patient's chart and labs.  Questions were answered to the patient's satisfaction.     Bensimhon, Reuel Boom
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
## 2016-10-31 NOTE — Care Management Note (Signed)
Case Management Note  Patient Details  Name: Billy Hood MRN: [id number] Date of Birth: 1932-05-31  Subjective/Objective:                    Action/Plan: Pt discharging home with self care. CM provided him and his son with a list of HH agencies. They selected [facility]. Mary with [facility] notified and accepted the referral.  Pt is going to his sons home over the weekend but plans to return to his home on Monday. Corrie Dandy made aware and provided her with Pts son Farrel Demark) number: [phone]. Pt has no PCP listed. He sees Dr Earnest Bailey at [facility]. Information given to [facility] at [place].   Expected Discharge Date:  10/31/16               Expected Discharge Plan:  Home w Home Health Services  In-House Referral:     Discharge planning Services  CM Consult  Post Acute Care Choice:  Home Health Choice offered to:  Patient, Adult Children  DME Arranged:    DME Agency:     HH Arranged:  PT, OT HH Agency:  [facility] (now [facility])  Status of Service:  Completed, signed off  If discussed at Long Length of Stay Meetings, dates discussed:    Additional Comments:  Kermit Balo, RN 10/31/2016, 3:54 PM

## 2016-10-31 NOTE — CV Procedure (Signed)
    TRANSESOPHAGEAL ECHOCARDIOGRAM   NAME:  Billy Hood   MRN: [id number] DOB:  June 03, 1932   ADMIT DATE: 10/29/2016  INDICATIONS:   PROCEDURE:   Informed consent was obtained prior to the procedure. The risks, benefits and alternatives for the procedure were discussed and the patient comprehended these risks.  Risks include, but are not limited to, cough, sore throat, vomiting, nausea, somnolence, esophageal and stomach trauma or perforation, bleeding, low blood pressure, aspiration, pneumonia, infection, trauma to the teeth and death.    After a procedural time-out, the patient was given 2 mg versed and 50 mcg fentanyl for moderate sedation.  The oropharynx was anesthetized with 10 cc of topical 1% viscous lidocaine.  The transesophageal probe was inserted in the esophagus and stomach without difficulty and multiple views were obtained.    COMPLICATIONS:    There were no immediate complications.  FINDINGS:  LEFT VENTRICLE: EF = 45%. Hypokinesis of anterior wall and anteroseptal wall  RIGHT VENTRICLE: Normal size and function.   LEFT ATRIUM: Mildly dialted  LEFT ATRIAL APPENDAGE: Not well visualized  RIGHT ATRIUM: Normal  AORTIC VALVE:  Trileaflet. Mildly calcified. No significant AS. Trivial AI  MITRAL VALVE:    Normal. Trivial MR   TRICUSPID VALVE: Normal. Trivial TR  PULMONIC VALVE: Grossly normal. Trivial PR  INTERATRIAL SEPTUM: Cannot exclude tiny PFO. Bubble study negative.  PERICARDIUM: No effusion  DESCENDING AORTA: Severe calcific plaque   Bensimhon, Daniel,MD 8:37 AM
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
## 2016-11-02 ENCOUNTER — Encounter (HOSPITAL_COMMUNITY): Payer: Self-pay | Admitting: Internal Medicine

## 2016-11-04 MED FILL — Lidocaine Inj 1% w/ Epinephrine-1:100000: INTRAMUSCULAR | Qty: 20 | Status: AC

## 2016-11-05 ENCOUNTER — Other Ambulatory Visit: Payer: Self-pay

## 2016-11-05 NOTE — Patient Outreach (Signed)
[facility]) Care Management  11/05/2016  Billy Hood [birth year] [id number]     EMMI-STROKE RED ON EMMI ALERT Day # 1 Date: 11/04/16 Red Alert Reason: "Feeling worse overall? Yes"     Outreach attempt # 1 to patient. No answer at present. RN CM left HIPAA compliant voicemail message along with contact info.        Plan: RN CM will make outreach attempt to patient within one business day if no return call from patient.    Antionette Fairy, RN,BSN,CCM [facility] Care Management Telephonic Care Management Coordinator Direct Phone: [phone] Toll Free: [phone] Fax: [phone]

## 2016-11-06 ENCOUNTER — Other Ambulatory Visit: Payer: Self-pay

## 2016-11-06 NOTE — Patient Outreach (Signed)
[facility]) Care Management  11/06/2016  Billy Hood January 29, 1932 [id number]   EMMI-STROKE RED ON EMMI ALERT Day # 1 Date: 11/04/16 Red Alert Reason: "Feeling worse overall? Yes"    Outreach attempt #2 to patient. No answer at present.    Plan: RN CM will make outreach attempt to patient within one business day if no return call from patient.    Antionette Fairy, RN,BSN,CCM [facility] Care Management Telephonic Care Management Coordinator Direct Phone: [phone] Toll Free: [phone] Fax: [phone]

## 2016-11-07 ENCOUNTER — Other Ambulatory Visit: Payer: Self-pay

## 2016-11-07 NOTE — Patient Outreach (Signed)
[facility]) Care Management  11/07/2016  Billy Hood 1931/10/23 [id number]   EMMI-STROKE RED ON EMMI ALERT Day # 1 Date: 11/04/16 Red Alert Reason: "Feeling worse overall? Yes"    Outreach attempt #3 to patient. NO answer and unable to leave message. RN CM has made multiple attempts to establish contact with patient. Case will be closed out at this time.    Plan: RN CM will notify [facility] administrative assistant of case status.    Antionette Fairy, RN,BSN,CCM [facility] Care Management Telephonic Care Management Coordinator Direct Phone: [phone] Toll Free: [phone] Fax: [phone]

## 2016-11-13 ENCOUNTER — Other Ambulatory Visit: Payer: Self-pay

## 2016-11-13 NOTE — Patient Outreach (Signed)
[facility]) Care Management  11/13/2016  Arby BarretteJames Meneely [birth year] [id number]      EMMI- STROKE RED ON EMMI ALERT Day # 9 Date: 11/12/16 Red Alert Reason: "Feeling worse overall? Yes"   "Lost interest in things they used to enjoy? Yes"    Outreach attempt # 1 to patient. Spoke with patient. He states he "really fells good." patient currently out and about walking and exercising. He states he "feels like I'm going to be somebody again." He denies any new and/or worsening problems since discharge. Reviewed and addressed red alert. Machine recorded inaccurate response for feeling worse. Patient states that the only thing he misses is being able to drive himself wherever he needs to go. He is hopeful that he will be cleared by MDs to resume driving. Patient states that he has PCP f/u appt on yesterday and everything went well. He voices that he goes next week for pacer wound check. He denies any issues with transportation. Patient reports that he has all his meds and knows when and how to take them. He is aware of worsening s/s of worsening condition, recurring stroke and when to seek medical attention. Advised patient that they would continue to get automated EMMI-Stroke post discharge calls to assess how they are doing following recent hospitalization and will receive a call from a nurse if any of their responses were abnormal. Patient voiced understanding and was appreciative of f/u call.       Plan: RN CM [facility] case status.   Antionette Fairyoshanda Maretta Overdorf, RN,BSN,CCM [facility] Care Management Telephonic Care Management Coordinator Direct Phone: [phone] Toll Free: [phone] Fax: [phone]

## 2016-11-17 ENCOUNTER — Ambulatory Visit (INDEPENDENT_AMBULATORY_CARE_PROVIDER_SITE_OTHER): Payer: Self-pay | Admitting: *Deleted

## 2016-11-17 ENCOUNTER — Other Ambulatory Visit: Payer: Self-pay

## 2016-11-17 DIAGNOSIS — I639 Cerebral infarction, unspecified: Secondary | ICD-10-CM

## 2016-11-17 LAB — CUP PACEART INCLINIC DEVICE CHECK
Date Time Interrogation Session: 20180507100211
MDC IDC PG IMPLANT DT: 20180420

## 2016-11-17 NOTE — Patient Outreach (Signed)
[facility]) Care Management  11/17/2016  Arby BarretteJames Lean Apr 05, 1932 [id number]     EMMI- STROKE RED ON EMMI ALERT Day # 13 Date: 11/16/16 Red Alert Reason: "Questions/problems with meds? Yes"   Outreach attempt # 1 to patient. No answer at present.        Plan: RN CM will make outreach attempt to patient within one business day.   Antionette Fairyoshanda Peightyn Roberson, RN,BSN,CCM [facility] Care Management Telephonic Care Management Coordinator Direct Phone: [phone] Toll Free: [phone] Fax: [phone]

## 2016-11-17 NOTE — Progress Notes (Signed)
Wound check appointment. Steri-strips removed prior to appointment. Wound without redness or edema. Incision edges approximated, wound well healed. Battery status: GOOD. R-waves 0.3939mV. 0 symptom episodes, 0 tachy episodes, 0 pause episodes, 0 brady episodes. 0 AF episodes (0% burden). Monthly summary reports and ROV with WC as needed.

## 2016-11-18 ENCOUNTER — Other Ambulatory Visit: Payer: Self-pay

## 2016-11-18 NOTE — Patient Outreach (Signed)
[facility]) Care Management  11/18/2016  Arby BarretteJames Fister May 31, 1932 [id number]   EMMI- STROKE RED ON EMMI ALERT Day # 13 Date: 11/16/16 Red Alert Reason: "Questions/problems with meds? Yes"     Outreach attempt # 2  to patient. No answer at present.   Plan: RN CM will make outreach attempt to patient within one business day.   Antionette Fairyoshanda Leyland Kenna, RN,BSN,CCM [facility] Care Management Telephonic Care Management Coordinator Direct Phone: [phone] Toll Free: [phone] Fax: [phone]

## 2016-11-19 ENCOUNTER — Other Ambulatory Visit: Payer: Self-pay

## 2016-11-19 NOTE — Patient Outreach (Signed)
[facility]) Care Management  11/19/2016  Billy Hood Jun 01, 1932 [id number]   EMMI- STROKE RED ON EMMI ALERT Day # 13 Date: 11/16/16 Red Alert Reason: "Questions/problems with meds? Yes"   Outreach attempt #3 to patient. No answer. RN CM has tried multiple attempts to contact patient to address red alert. Case is being closed at this time due to inability to reach patient.    Plan: RN CM will notify [facility] administrative assistant of case status.  Antionette Fairyoshanda Laiken Sandy, RN,BSN,CCM [facility] Care Management Telephonic Care Management Coordinator Direct Phone: [phone] Toll Free: [phone] Fax: [phone]

## 2016-12-01 ENCOUNTER — Ambulatory Visit (INDEPENDENT_AMBULATORY_CARE_PROVIDER_SITE_OTHER): Payer: Medicare Other | Admitting: *Deleted

## 2016-12-01 DIAGNOSIS — I639 Cerebral infarction, unspecified: Secondary | ICD-10-CM | POA: Diagnosis not present

## 2016-12-02 NOTE — Progress Notes (Signed)
Carelink Summary Report / Loop Recorder 

## 2016-12-04 LAB — CUP PACEART REMOTE DEVICE CHECK
MDC IDC PG IMPLANT DT: 20180420
MDC IDC SESS DTM: 20180520183548

## 2016-12-30 ENCOUNTER — Ambulatory Visit (INDEPENDENT_AMBULATORY_CARE_PROVIDER_SITE_OTHER): Payer: Medicare Other | Admitting: *Deleted

## 2016-12-30 DIAGNOSIS — I639 Cerebral infarction, unspecified: Secondary | ICD-10-CM

## 2017-01-01 NOTE — Progress Notes (Signed)
Carelink Summary Report / Loop Recorder 

## 2017-01-07 LAB — CUP PACEART REMOTE DEVICE CHECK
Date Time Interrogation Session: 20180619184155
Implantable Pulse Generator Implant Date: 20180420

## 2017-01-11 DEATH — deceased

## 2017-01-21 ENCOUNTER — Telehealth: Payer: Self-pay | Admitting: Cardiology

## 2017-01-21 NOTE — Telephone Encounter (Signed)
Attempted to call patient and request a manual transmission b/c his home monitor has not updated in the last 14 days. Automated message stated that number is not in service.

## 2017-01-28 ENCOUNTER — Encounter: Payer: Self-pay | Admitting: Cardiology

## 2017-01-29 ENCOUNTER — Ambulatory Visit (INDEPENDENT_AMBULATORY_CARE_PROVIDER_SITE_OTHER): Payer: Medicare Other | Admitting: *Deleted

## 2017-01-29 DIAGNOSIS — I639 Cerebral infarction, unspecified: Secondary | ICD-10-CM

## 2017-02-03 NOTE — Progress Notes (Signed)
Carelink Summary Report / Loop Recorder 

## 2017-02-13 ENCOUNTER — Encounter: Payer: Self-pay | Admitting: Cardiology

## 2017-02-13 LAB — CUP PACEART REMOTE DEVICE CHECK
Implantable Pulse Generator Implant Date: 20180420
MDC IDC SESS DTM: 20180720043956

## 2017-03-02 ENCOUNTER — Encounter: Payer: Self-pay | Admitting: *Deleted

## 2017-12-15 IMAGING — MR MR MRA HEAD W/O CM
9 of 11 series · 31 of 48 positions shown · non-contrast
Comparison: 10/29/2016 CT head

CLINICAL DATA: 84 y/o  M; increasing confusion.

EXAM:
MRI HEAD WITHOUT CONTRAST
MRA HEAD WITHOUT CONTRAST
TECHNIQUE: Multiplanar, multiecho pulse sequences of the brain and surrounding
structures were obtained without intravenous contrast. Angiographic
images of the head were obtained using MRA technique without
contrast.

[Series 3: DWI · axial · 3.0mm · 1.09mm/px · z∈[-0,+137]mm · 7 of 94 slices shown (1 of 4)]
[im 1/94]
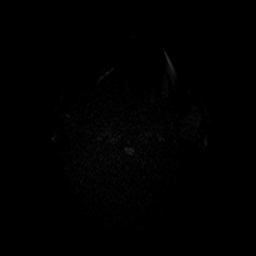
[im 16/94]
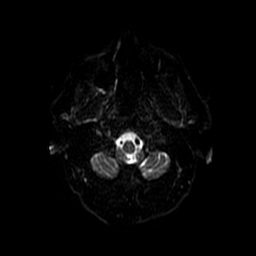
[im 32/94]
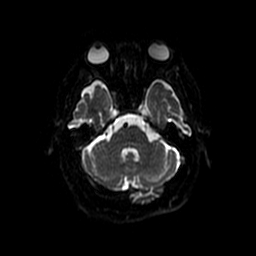
[im 47/94]
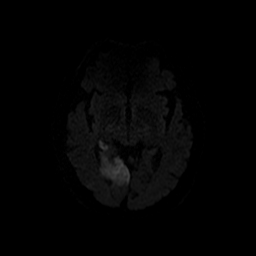
[im 63/94]
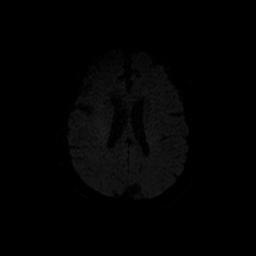
[im 78/94]
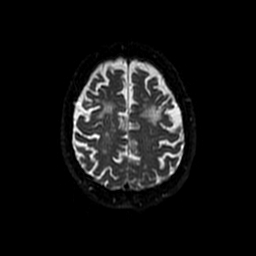
[im 94/94]
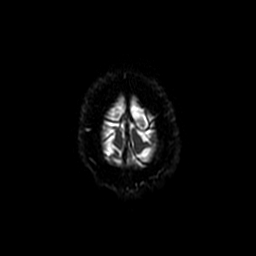

[Series 4: (id) mt fs · axial · 1.4mm · 0.43mm/px · z∈[-8,+30]mm · 4 of 136 slices shown]
[im 1/136]
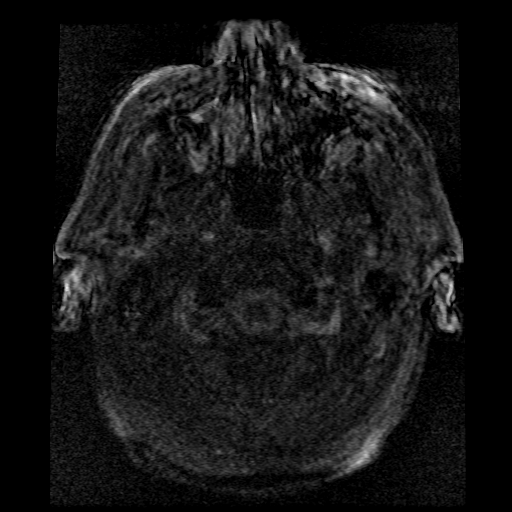
[im 28/136]
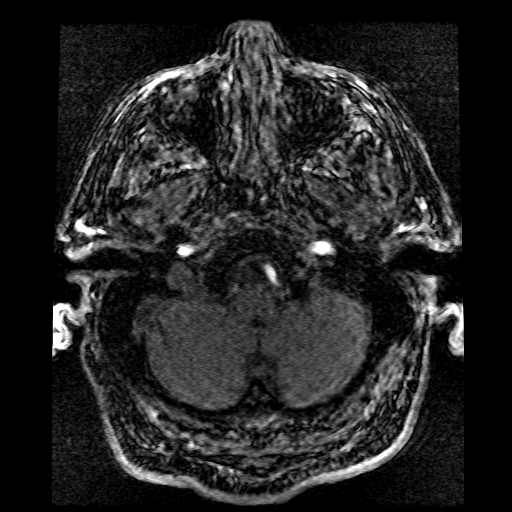
[im 41/136]
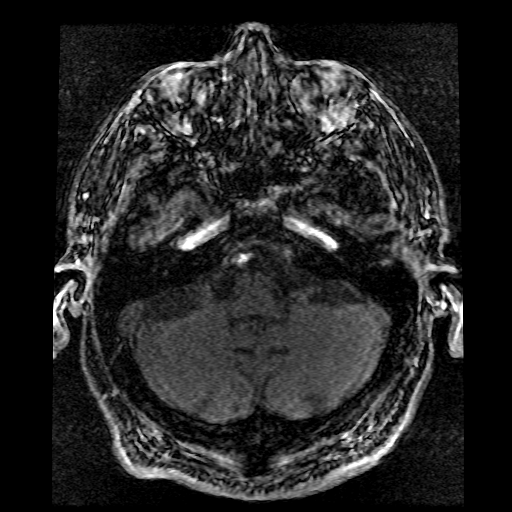
[im 55/136]
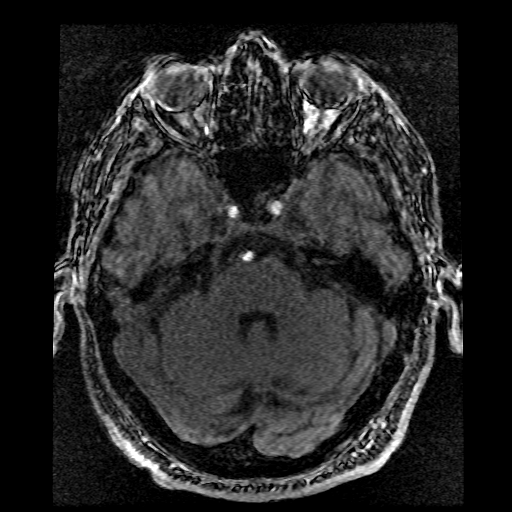

[Series 5: DWI · coronal · 5.0mm · 1.09mm/px · 5 of 66 slices shown (2 of 4)]
[im 1/66]
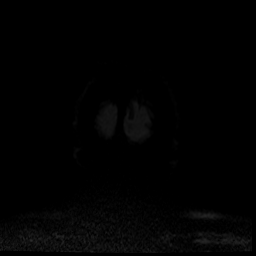
[im 17/66]
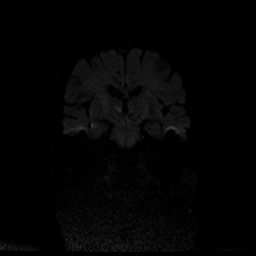
[im 33/66]
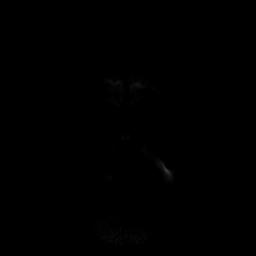
[im 49/66]
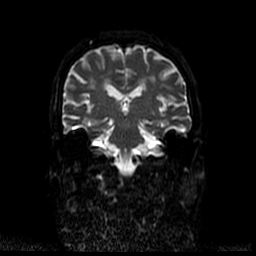
[im 66/66]
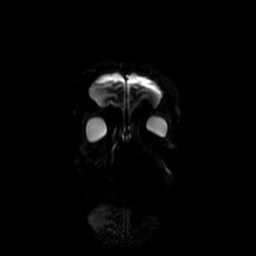

[Series 6: T1 · sagittal · 5.0mm · 0.47mm/px · 2 of 23 slices shown]
[im 1/23]
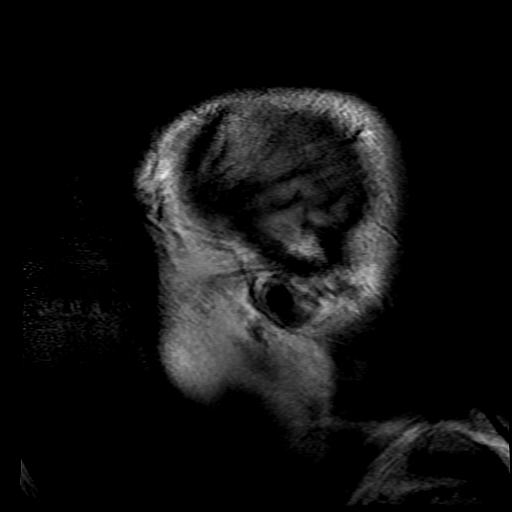
[im 23/23]
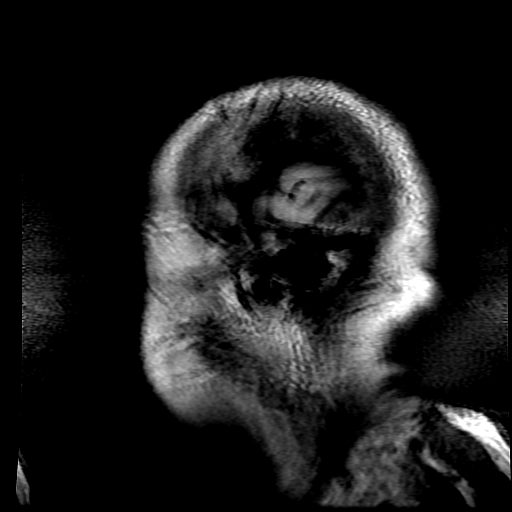

[Series 7: T2 · axial · 5.0mm · 0.43mm/px · z∈[-4,+134]mm · 2 of 24 slices shown (1 of 2)]
[im 1/24]
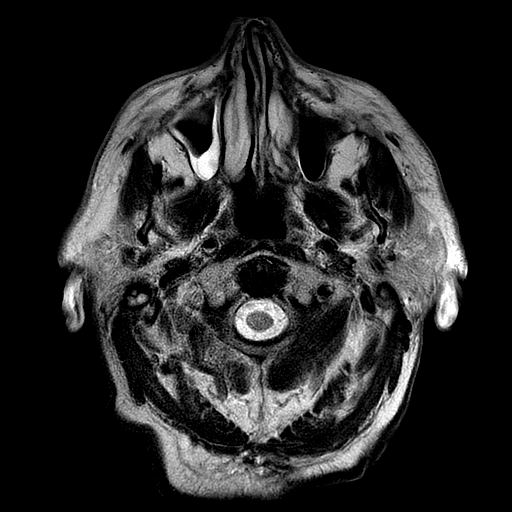
[im 24/24]
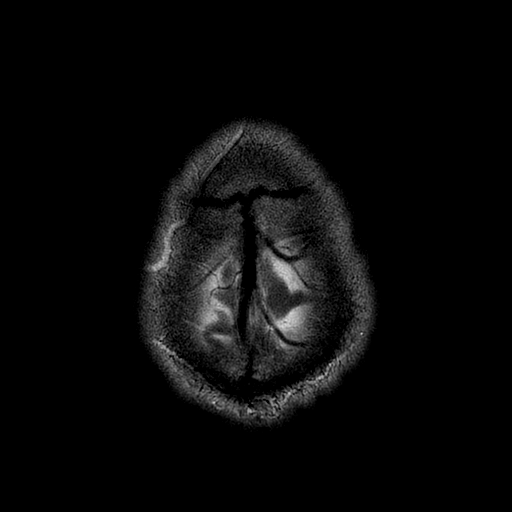

[Series 8: FLAIR · axial · 5.0mm · 0.43mm/px · z∈[-4,+134]mm · 2 of 24 slices shown]
[im 1/24]
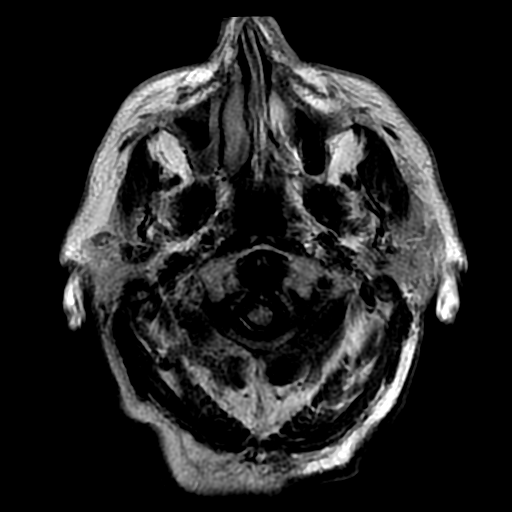
[im 24/24]
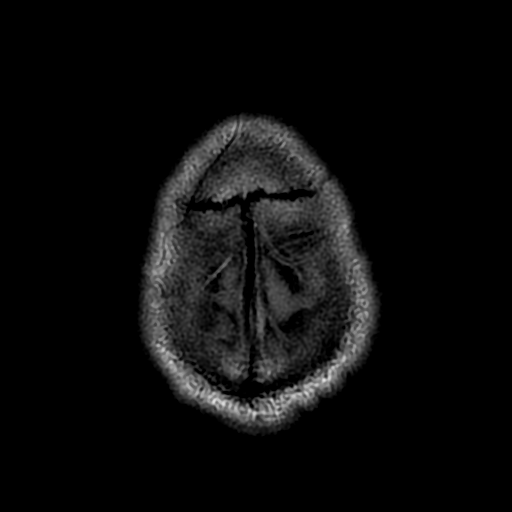

[Series 11: T2 · coronal · 5.0mm · 0.43mm/px · 2 of 28 slices shown (2 of 2)]
[im 1/28]
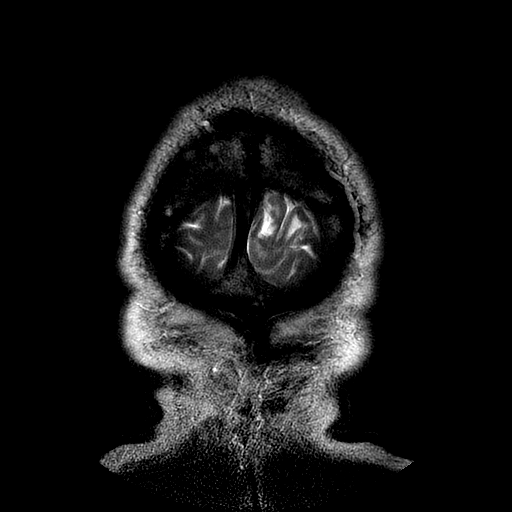
[im 28/28]
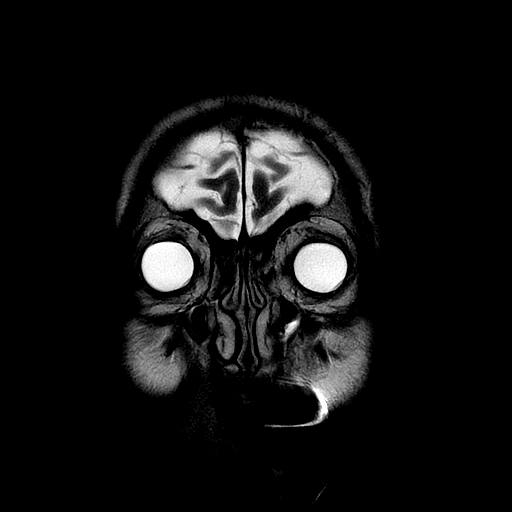

[Series 300: DWI · axial · 3.0mm · 1.09mm/px · z∈[-0,+137]mm · 4 of 47 slices shown (3 of 4)]
[im 1/47]
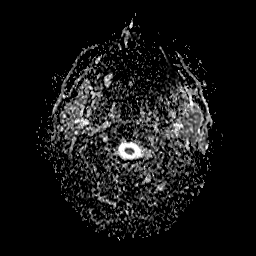
[im 16/47]
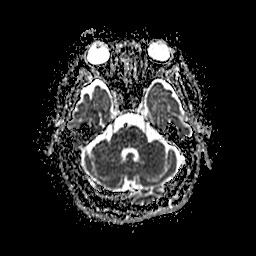
[im 31/47]
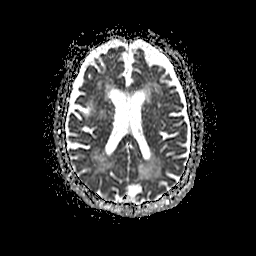
[im 47/47]
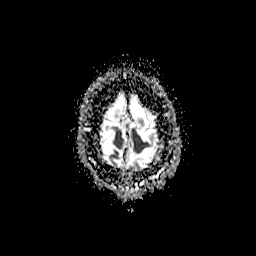

[Series 500: DWI · coronal · 5.0mm · 1.09mm/px · 3 of 33 slices shown (4 of 4)]
[im 1/33]
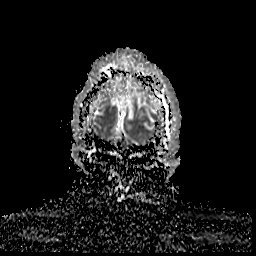
[im 17/33]
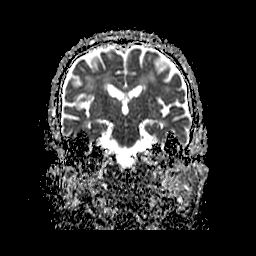
[im 33/33]
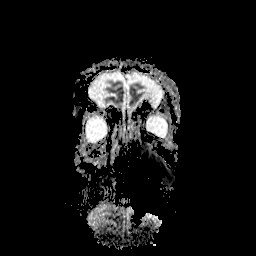

[31 of 48 positions shown; findings below may reference images not displayed]

FINDINGS: MRI HEAD FINDINGS

Brain: Right PCA distribution acute/ early subacute infarct
involving the medial temporal lobe and occipital lobe with small
focus in the right thalamus. Infarction has associated T2
hyperintense signal abnormality and minimal local mass effect.

There are chronic infarcts in the right frontal and left parietal
lobes, advanced chronic microvascular ischemic changes of white
matter, and moderate brain parenchymal volume loss. No evidence of
acute intracranial hemorrhage. No extra-axial collection,
hydrocephalus, or effacement of the basilar cisterns.

Vascular: Normal flow voids.

Skull and upper cervical spine: Normal marrow signal.

Sinuses/Orbits: Right greater than left maxillary sinus
opacification and small right maxillary sinus fluid level. Trace
right mastoid effusion. Orbits are unremarkable.

Other: None.

MRA HEAD FINDINGS

Moderate motion degradation, suboptimal evaluation for subtle or
small aneurysm.

Anterior circulation: No large vessel occlusion, large aneurysm, or
high-grade stenosis is identified.

Posterior circulation No large vessel occlusion, large aneurysm, or
high-grade stenosis is identified.

Anatomic variant: Left dominant vertebrobasilar system. Probable
small left posterior communicating artery. Patent anterior
communicating artery. No right posterior communicating artery
identified, likely hypoplastic or absent.
IMPRESSION: MRI head:

1. Acute/early subacute right posterior cerebral artery distribution
infarction involving medial temporal lobe, occipital lobe, and small
focus in right thalamus. No associated hemorrhage.
2. Background of advanced chronic microvascular ischemic changes of
the brain, moderate parenchymal volume loss, with chronic infarcts
in the right frontal and left parietal lobes.
3. Maxillary sinus disease with small right maxillary sinus fluid
level which may represent acute sinusitis in the appropriate
clinical setting.
MRA head:

1. Moderate motion degradation, suboptimal evaluation for stenosis
or small aneurysm.
2. Patent circle of Willis.  No large vessel occlusion identified.

By: Keydi Majluf M.D.
